# Patient Record
Sex: Male | Born: 1955
Health system: Southern US, Community
[De-identification: ages and names within clinical notes are randomized; demographics above are authoritative.]

## PROBLEM LIST (undated history)

## (undated) DIAGNOSIS — I10 Essential (primary) hypertension: Secondary | ICD-10-CM

## (undated) DIAGNOSIS — E669 Obesity, unspecified: Secondary | ICD-10-CM

## (undated) DIAGNOSIS — Z720 Tobacco use: Secondary | ICD-10-CM

## (undated) DIAGNOSIS — E785 Hyperlipidemia, unspecified: Secondary | ICD-10-CM

## (undated) DIAGNOSIS — I2510 Atherosclerotic heart disease of native coronary artery without angina pectoris: Secondary | ICD-10-CM

## (undated) HISTORY — DX: Essential (primary) hypertension: I10

## (undated) HISTORY — DX: Obesity, unspecified: E66.9

## (undated) HISTORY — DX: Atherosclerotic heart disease of native coronary artery without angina pectoris: I25.10

## (undated) HISTORY — DX: Hyperlipidemia, unspecified: E78.5

## (undated) HISTORY — DX: Tobacco use: Z72.0

---

## 2015-09-21 ENCOUNTER — Encounter: Payer: Self-pay | Admitting: Osteopathic Medicine

## 2015-09-21 ENCOUNTER — Ambulatory Visit (INDEPENDENT_AMBULATORY_CARE_PROVIDER_SITE_OTHER): Payer: 59 | Admitting: Osteopathic Medicine

## 2015-09-21 VITALS — BP 146/83 | HR 89 | Ht 66.0 in | Wt 146.0 lb

## 2015-09-21 DIAGNOSIS — Z720 Tobacco use: Secondary | ICD-10-CM

## 2015-09-21 DIAGNOSIS — J029 Acute pharyngitis, unspecified: Secondary | ICD-10-CM

## 2015-09-21 DIAGNOSIS — Z716 Tobacco abuse counseling: Secondary | ICD-10-CM

## 2015-09-21 DIAGNOSIS — Z5181 Encounter for therapeutic drug level monitoring: Secondary | ICD-10-CM

## 2015-09-21 MED ORDER — BUPROPION HCL ER (SMOKING DET) 150 MG PO TB12: ORAL_TABLET | ORAL | Status: DC

## 2015-09-21 NOTE — Progress Notes (Signed)
HPI: Carlos Valentine is a 60 y.o. male who presents to [facility] today for chief complaint of:  Chief Complaint  Patient presents with  . Establish Care    Patient wants to discuss quit smoking and has a swollen gland on left side of throat    Smoking: pt has 38 pack year smoking history and he decided he wants to quit! He is very motivated, wants to quit right away but he would like help with a prescription. He is nervous about the side effects and cost of Chantix. He has never tried to quit before.   Sore throat: feeling sick lately, has noticed mildly tender enlarged lymph node on left side of the neck, no skin changes or drainage, no difficulty swallowing.  Past medical, social and family history reviewed: History reviewed. No pertinent past medical history. History reviewed. No pertinent past surgical history. Social History  Substance Use Topics  . Smoking status: Current Every Day Smoker  . Smokeless tobacco: Not on file  . Alcohol Use: Not on file   History reviewed. No pertinent family history.  Current Outpatient Prescriptions  Medication Sig Dispense Refill  . aspirin 81 MG tablet Take 81 mg by mouth daily.     No current facility-administered medications for this visit.   No Known Allergies    Review of Systems: CONSTITUTIONAL:  No  fever, no chills, No  unintentional weight changes HEAD/EYES/EARS/NOSE/THROAT: No  headache, no vision change, no hearing change, (+) sore throat/lump in throat per HPI, No  sinus pressure CARDIAC: No  chest pain, No  pressure, No palpitations, No  orthopnea RESPIRATORY: No  cough, No  shortness of breath/wheeze GASTROINTESTINAL: No  nausea, No  vomiting, No  abdominal pain, No  blood in stool, No  diarrhea, No  constipation  MUSCULOSKELETAL: No  myalgia/arthralgia GENITOURINARY: No  incontinence, No  abnormal genital bleeding/discharge SKIN: No  rash/wounds/concerning lesions HEM/ONC: No  easy  bruising/bleeding, No  abnormal lymph node ENDOCRINE: No polyuria/polydipsia/polyphagia, No  heat/cold intolerance  NEUROLOGIC: No  weakness, No  dizziness, No  slurred speech PSYCHIATRIC: No  concerns with depression, No  concerns with anxiety, (+) sleep problems  Exam:  BP 146/83 mmHg  Pulse 89  Ht  (1.676 m)  Wt 146 lb (66.225 kg)  BMI 23.58 kg/m2 Constitutional: VS see above. General Appearance: alert, well-developed, well-nourished, NAD Eyes: Normal lids and conjunctive, non-icteric sclera, PERRLA Ears, Nose, Mouth, Throat: MMM, Normal external inspection ears/nares/mouth/lips/gums, TM normal bilaterally. Pharynx no erythema, no exudate.  Neck: No masses, trachea midline. No thyroid enlargement/tenderness/mass appreciated. Mild tender to palpation anterior cervical lymph node on left, about same size as on right, nondraining, normal pharynx, no other enlarged last palpable lymph nodes on cervical posterior chain or supraclavicular Respiratory: Normal respiratory effort. no wheeze, no rhonchi, no rales3 Cardiovascular: S1/S2 normal, no murmur, no rub/gallop auscultated. RRR.  Gastrointestinal: Nontender, no masses. No hepatomegaly, no splenomegaly. No hernia appreciated. Bowel sounds normal. Rectal exam deferred.  Musculoskeletal: Gait normal. No clubbing/cyanosis of digits.  Neurological: No cranial nerve deficit on limited exam. Motor and sensation intact and symmetric Skin: warm, dry, intact. No rash/ulcer. No concerning nevi or subq nodules on limited exam.   Psychiatric: Normal judgment/insight. Normal mood and affect. Oriented x3.    ASSESSMENT/PLAN:  Encounter for tobacco use cessation counseling  - Rx sent to [facility] per patient request, also printed Rx and attached coupon for [facility] in case can get cheaper there. - Plan: buPROPion (ZYBAN)  150 MG 12 hr tablet. Patient has been educated on significant possible side effects of medication and is instructed to contact me or  other medical professional with any concerns about side effects.   Tobacco abuse disorder - 38 years at 1 ppd. Patient has been educated >23mins on importance of tobacco cessation to decrease risks of cardiovascular and pulmonary disease and to improve overall health & wellbeing.   Med monitoring - CBC and CMP for medication safety  Sore throat - mild enlarged LN bilateral ant cervical - no post cervical or supraclavicular LN, pt advised keep an eye on this and if persists more than 1 week will consider ultrasound or other workup. CBC as above   Return at your convenience for [facility] PHYSICAL EXAM. and recheck blood pressure

## 2015-09-21 NOTE — Patient Instructions (Signed)
Smoking Cessation, Tips for Success If you are ready to quit smoking, congratulations! You have chosen to help yourself be healthier. Cigarettes bring nicotine, tar, carbon monoxide, and other irritants into your body. Your lungs, heart, and blood vessels will be able to work better without these poisons. There are many different ways to quit smoking. Nicotine gum, nicotine patches, a nicotine inhaler, or nicotine nasal spray can help with physical craving. Hypnosis, support groups, and medicines help break the habit of smoking. WHAT THINGS CAN I DO TO MAKE QUITTING EASIER?  Here are some tips to help you quit for good:  Pick a date when you will quit smoking completely. Tell all of your friends and family about your plan to quit on that date.  Do not try to slowly cut down on the number of cigarettes you are smoking. Pick a quit date and quit smoking completely starting on that day.  Throw away all cigarettes.   Clean and remove all ashtrays from your home, work, and car.  On a card, write down your reasons for quitting. Carry the card with you and read it when you get the urge to smoke.  Cleanse your body of nicotine. Drink enough water and fluids to keep your urine clear or pale yellow. Do this after quitting to flush the nicotine from your body.  Learn to predict your moods. Do not let a bad situation be your excuse to have a cigarette. Some situations in your life might tempt you into wanting a cigarette.  Never have "just one" cigarette. It leads to wanting another and another. Remind yourself of your decision to quit.  Change habits associated with smoking. If you smoked while driving or when feeling stressed, try other activities to replace smoking. Stand up when drinking your coffee. Brush your teeth after eating. Sit in a different chair when you read the paper. Avoid alcohol while trying to quit, and try to drink fewer caffeinated beverages. Alcohol and caffeine may urge you to  smoke.  Avoid foods and drinks that can trigger a desire to smoke, such as sugary or spicy foods and alcohol.  Ask people who smoke not to smoke around you.  Have something planned to do right after eating or having a cup of coffee. For example, plan to take a walk or exercise.  Try a relaxation exercise to calm you down and decrease your stress. Remember, you may be tense and nervous for the first 2 weeks after you quit, but this will pass.  Find new activities to keep your hands busy. Play with a pen, coin, or rubber band. Doodle or draw things on paper.  Brush your teeth right after eating. This will help cut down on the craving for the taste of tobacco after meals. You can also try mouthwash.   Use oral substitutes in place of cigarettes. Try using lemon drops, carrots, cinnamon sticks, or chewing gum. Keep them handy so they are available when you have the urge to smoke.  When you have the urge to smoke, try deep breathing.  Designate your home as a nonsmoking area.  If you are a heavy smoker, ask your health care provider about a prescription for nicotine chewing gum. It can ease your withdrawal from nicotine.  Reward yourself. Set aside the cigarette money you save and buy yourself something nice.  Look for support from others. Join a support group or smoking cessation program. Ask someone at home or at work to help you with your plan   to quit smoking.  Always ask yourself, "Do I need this cigarette or is this just a reflex?" Tell yourself, "Today, I choose not to smoke," or "I do not want to smoke." You are reminding yourself of your decision to quit.  Do not replace cigarette smoking with electronic cigarettes (commonly called e-cigarettes). The safety of e-cigarettes is unknown, and some may contain harmful chemicals.  If you relapse, do not give up! Plan ahead and think about what you will do the next time you get the urge to smoke. HOW WILL I FEEL WHEN I QUIT SMOKING? You  may have symptoms of withdrawal because your body is used to nicotine (the addictive substance in cigarettes). You may crave cigarettes, be irritable, feel very hungry, cough often, get headaches, or have difficulty concentrating. The withdrawal symptoms are only temporary. They are strongest when you first quit but will go away within 10-14 days. When withdrawal symptoms occur, stay in control. Think about your reasons for quitting. Remind yourself that these are signs that your body is healing and getting used to being without cigarettes. Remember that withdrawal symptoms are easier to treat than the major diseases that smoking can cause.  Even after the withdrawal is over, expect periodic urges to smoke. However, these cravings are generally short lived and will go away whether you smoke or not. Do not smoke! WHAT RESOURCES ARE AVAILABLE TO HELP ME QUIT SMOKING? Your health care provider can direct you to community resources or hospitals for support, which may include:  Group support.  Education.  Hypnosis.  Therapy.   This information is not intended to replace advice given to you by your health care provider. Make sure you discuss any questions you have with your health care provider.   Document Released: 05/19/2004 Document Revised: 09/11/2014 Document Reviewed: 02/06/2013 Elsevier Interactive Patient Education 2016 [employer].    Bupropion sustained-release tablets (smoking cessation) What is this medicine? BUPROPION (byoo PROE pee on) is used to help people quit smoking. This medicine may be used for other purposes; ask your health care provider or pharmacist if you have questions.  How should I use this medicine? Take this medicine by mouth with a glass of water. Follow the directions on the prescription label. You can take it with or without food. If it upsets your stomach, take it with food. Do not cut, crush or chew this medicine. Take your medicine at regular intervals. If  you take this medicine more than once a day, take your second dose at least 8 hours after you take your first dose. To limit difficulty in sleeping, avoid taking this medicine at bedtime. Do not take your medicine more often than directed. Do not stop taking this medicine suddenly except upon the advice of your doctor. Stopping this medicine too quickly may cause serious side effects. A special MedGuide will be given to you by the pharmacist with each prescription and refill. Be sure to read this information carefully each time. Talk to your pediatrician regarding the use of this medicine in children. Special care may be needed. Overdosage: If you think you have taken too much of this medicine contact a poison control center or emergency room at once. NOTE: This medicine is only for you. Do not share this medicine with others.  What if I miss a dose? If you miss a dose, skip the missed dose and take your next tablet at the regular time. There should be at least 8 hours between doses. Do not take  double or extra doses.  What should I watch for while using this medicine? Visit your doctor or health care professional for regular checks on your progress. This medicine should be used together with a patient support program. It is important to participate in a behavioral program, counseling, or other support program that is recommended by your health care professional. Patients and their families should watch out for new or worsening thoughts of suicide or depression. Also watch out for sudden changes in feelings such as feeling anxious, agitated, panicky, irritable, hostile, aggressive, impulsive, severely restless, overly excited and hyperactive, or not being able to sleep. If this happens, especially at the beginning of treatment or after a change in dose, call your health care professional. Avoid alcoholic drinks while taking this medicine. Drinking excessive alcoholic beverages, using sleeping or anxiety  medicines, or quickly stopping the use of these agents while taking this medicine may increase your risk for a seizure. Do not drive or use heavy machinery until you know how this medicine affects you. This medicine can impair your ability to perform these tasks. Do not take this medicine close to bedtime. It may prevent you from sleeping. Your mouth may get dry. Chewing sugarless gum or sucking hard candy, and drinking plenty of water may help. Contact your doctor if the problem does not go away or is severe. Do not use nicotine patches or chewing gum without the advice of your doctor or health care professional while taking this medicine. You may need to have your blood pressure taken regularly if your doctor recommends that you use both nicotine and this medicine together.  What side effects may I notice from receiving this medicine? Side effects that you should report to your doctor or health care professional as soon as possible: -allergic reactions like skin rash, itching or hives, swelling of the face, lips, or tongue -breathing problems -changes in vision -confusion -fast or irregular heartbeat -hallucinations -increased blood pressure -redness, blistering, peeling or loosening of the skin, including inside the mouth -seizures -suicidal thoughts or other mood changes -unusually weak or tired -vomiting  Side effects that usually do not require medical attention (report to your doctor or health care professional if they continue or are bothersome): -change in sex drive or performance -constipation -headache -loss of appetite -nausea -tremors -weight loss This list may not describe all possible side effects. Call your doctor for medical advice about side effects. You may report side effects to FDA at 1-800-FDA-1088.  Where should I keep my medicine? Keep out of the reach of children. Store at room temperature between 20 and 25 degrees C (68 and 77 degrees F). Protect from light.  Keep container tightly closed. Throw away any unused medicine after the expiration date. NOTE: This sheet is a summary. It may not cover all possible information. If you have questions about this medicine, talk to your doctor, pharmacist, or health care provider.    2016, Elsevier/Gold Standard. (2013-04-18 10:55:10)

## 2015-09-22 ENCOUNTER — Telehealth: Payer: Self-pay | Admitting: Osteopathic Medicine

## 2015-09-22 DIAGNOSIS — F17200 Nicotine dependence, unspecified, uncomplicated: Secondary | ICD-10-CM | POA: Insufficient documentation

## 2015-09-22 DIAGNOSIS — J029 Acute pharyngitis, unspecified: Secondary | ICD-10-CM | POA: Insufficient documentation

## 2015-09-22 DIAGNOSIS — Z716 Tobacco abuse counseling: Secondary | ICD-10-CM | POA: Insufficient documentation

## 2015-09-22 DIAGNOSIS — Z720 Tobacco use: Secondary | ICD-10-CM | POA: Insufficient documentation

## 2015-09-22 MED ORDER — BUPROPION HCL ER (SMOKING DET) 150 MG PO TB12: ORAL_TABLET | ORAL | Status: DC

## 2015-09-22 NOTE — Telephone Encounter (Signed)
Pt returned your call.  

## 2015-09-22 NOTE — Telephone Encounter (Signed)
I left a message on patient vm advising him to go to lab and get lab test done. Rhonda Cunningham,CMA

## 2015-09-23 ENCOUNTER — Telehealth: Payer: Self-pay | Admitting: Osteopathic Medicine

## 2015-09-23 NOTE — Telephone Encounter (Signed)
Received fax for prior authorization on Bupropion HCL ER sent through cover my meds waiting on authorization. - CF

## 2015-09-28 ENCOUNTER — Telehealth: Payer: Self-pay | Admitting: Osteopathic Medicine

## 2015-09-28 NOTE — Telephone Encounter (Signed)
Patient started his medication 09/27/15 and 09/28/15 and just wanted to let you know and will take up until 10/11/15. Thanks

## 2015-09-28 NOTE — Telephone Encounter (Signed)
OKBjorn Loser Aaralynn Shepheard,CMA

## 2015-10-13 NOTE — Telephone Encounter (Signed)
This medication is on members list of covered drugs and does not require a prior authorization. - CF

## 2016-11-13 ENCOUNTER — Ambulatory Visit: Payer: 59 | Admitting: Osteopathic Medicine

## 2016-11-16 ENCOUNTER — Encounter: Payer: Self-pay | Admitting: Osteopathic Medicine

## 2016-11-16 ENCOUNTER — Ambulatory Visit (INDEPENDENT_AMBULATORY_CARE_PROVIDER_SITE_OTHER): Payer: 59 | Admitting: Osteopathic Medicine

## 2016-11-16 VITALS — BP 131/74 | HR 80 | Ht 65.0 in | Wt 177.0 lb

## 2016-11-16 DIAGNOSIS — Z122 Encounter for screening for malignant neoplasm of respiratory organs: Secondary | ICD-10-CM | POA: Diagnosis not present

## 2016-11-16 DIAGNOSIS — F17200 Nicotine dependence, unspecified, uncomplicated: Secondary | ICD-10-CM

## 2016-11-16 DIAGNOSIS — Z0000 Encounter for general adult medical examination without abnormal findings: Secondary | ICD-10-CM | POA: Diagnosis not present

## 2016-11-16 DIAGNOSIS — N529 Male erectile dysfunction, unspecified: Secondary | ICD-10-CM

## 2016-11-16 DIAGNOSIS — Z1211 Encounter for screening for malignant neoplasm of colon: Secondary | ICD-10-CM

## 2016-11-16 MED ORDER — VARENICLINE TARTRATE 1 MG PO TABS: 1.0000 mg | ORAL_TABLET | Freq: Two times a day (BID) | ORAL | 2 refills | Status: DC

## 2016-11-16 MED ORDER — VARENICLINE TARTRATE 0.5 MG X 11 & 1 MG X 42 PO MISC: ORAL | 0 refills | Status: DC

## 2016-11-16 MED ORDER — SILDENAFIL CITRATE 20 MG PO TABS: 20.0000 mg | ORAL_TABLET | ORAL | 3 refills | Status: DC | PRN

## 2016-11-16 NOTE — Progress Notes (Signed)
HPI: Carlos Valentine is a 61 y.o. male  who presents to [facility] today, 11/16/16,  for chief complaint of:  Chief Complaint  Patient presents with  . Annual Exam    Annual physical exam.  Few minor complaints at this time.   Still smoking - quit for awhile but started back again. Would like help quitting. Open to trying Chantix but a bit nervous about side effects.   ED - able to get an erection but difficulty maintaining one. No depression/weight gain/fatigue.    Past medical, surgical, social and family history reviewed: Patient Active Problem List   Diagnosis Date Noted  . Sore throat 09/22/2015  . Encounter for tobacco use cessation counseling 09/22/2015  . Tobacco abuse disorder 09/22/2015   No past surgical history on file. Social History  Substance Use Topics  . Smoking status: Current Every Day Smoker  . Smokeless tobacco: Never Used  . Alcohol use Not on file   No family history on file.   Current medication list and allergy/intolerance information reviewed   No Known Allergies    Review of Systems:  Constitutional:  No  fever, no chills, No recent illness, No unintentional weight changes. No significant fatigue.    HEENT: No  headache, no vision change, no hearing change, No sore throat, No  sinus pressure  Cardiac: No  chest pain, No  pressure, No palpitations,  Respiratory:  No  shortness of breath. No  Cough  Gastrointestinal: No  abdominal pain, No  nausea, No  vomiting,  No  blood in stool, No  diarrhea, No  constipation   Musculoskeletal: No new myalgia/arthralgia  Genitourinary:No  abnormal genital bleeding, No abnormal genital discharge  Skin: No  Rash,   Neurologic: No  weakness, No  dizziness  Psychiatric: No  concerns with depression, No  concerns with anxiety, No sleep problems, No mood problems  Exam:  BP 120/83   Pulse 68   Ht 5\' 5"  (1.651 m)   Wt 177 lb (80.3 kg)   BMI 29.45 kg/m    Constitutional: VS see above. General Appearance: alert, well-developed, well-nourished, NAD  Eyes: Normal lids and conjunctive, non-icteric sclera  Ears, Nose, Mouth, Throat: MMM, Normal external inspection ears/nares/mouth/lips/gums. TM normal bilaterally. Pharynx/tonsils no erythema, no exudate. Nasal mucosa normal.   Neck: No masses, trachea midline. No thyroid enlargement. No tenderness/mass appreciated. No lymphadenopathy  Respiratory: Normal respiratory effort. no wheeze, no rhonchi, no rales  Cardiovascular: S1/S2 normal, no murmur, no rub/gallop auscultated. RRR. No lower extremity edema. Pedal pulse II/IV bilaterally DP and PT. No carotid bruit or JVD. No abdominal aortic bruit.  Gastrointestinal: Nontender, no masses. No hepatomegaly, no splenomegaly. No hernia appreciated. Bowel sounds normal. Rectal exam deferred.   Musculoskeletal: Gait normal. No clubbing/cyanosis of digits.   Neurological: Normal balance/coordination. No tremor. No cranial nerve deficit on limited exam. Motor and sensation intact and symmetric. Cerebellar reflexes intact.   Skin: warm, dry, intact. No rash/ulcer. No concerning nevi or subq nodules on limited exam.    Psychiatric: Normal judgment/insight. Normal mood and affect. Oriented x3.      ASSESSMENT/PLAN:   Annual physical exam - Plan: CBC with Differential/Platelet, COMPLETE METABOLIC PANEL WITH GFR, Lipid panel, TSH, HIV antibody, Hepatitis C antibody, PSA, Total with Reflex to PSA, Free  Colon cancer screening - Plan: Ambulatory referral to Gastroenterology  Encounter for screening for lung cancer - Plan: Ambulatory Referral for Lung Cancer Scre  Erectile dysfunction, unspecified erectile dysfunction type -  Plan: sildenafil (REVATIO) 20 MG tablet  Tobacco dependence - Quit date advised, Chantix Rx and opsisble options for starting/taking this medicine reviewed, stop Rx and alert me/to ER if any concerning side effects  - Plan:  Ambulatory Referral for Lung Cancer Scre, varenicline (CHANTIX CONTINUING MONTH PAK) 1 MG tablet, varenicline (CHANTIX STARTING MONTH PAK) 0.5 MG X 11 & 1 MG X 42 tablet    MALE PREVENTIVE CARE  updated 11/16/16  ANNUAL SCREENING/COUNSELING  Any changes to health in the past year? no  Diet/Exercise - HEALTHY HABITS DISCUSSED TO DECREASE CV RISK History  Smoking Status  . Current Every Day Smoker  Smokeless Tobacco  . Never Used   History  Alcohol use Not on file   [facility]  Decreased Interest 0  Down, Depressed, Hopeless 0  PHQ - 2 Score 0  Altered sleeping 0  Tired, decreased energy 0  Change in appetite 0  Feeling bad or failure about yourself  0  Trouble concentrating 0  Moving slowly or fidgety/restless 0  Suicidal thoughts 0  PHQ-9 Score 0    SEXUAL/REPRODUCTIVE HEALTH  Sexually active in the past year? - Yes with male.  STI testing needed/desired today? - no  Any concerns with testosterone/libido? - no  INFECTIOUS DISEASE SCREENING  HIV - needs  GC/CT - does not need  HepC - needs  TB - does not need  CANCER SCREENING  Lung - needs  Colon - needs  Prostate - does not need  OTHER DISEASE SCREENING  Lipid - needs  DM2 - needs  AAA - will need US at 65  ADULT VACCINATION  Influenza - annual vaccine recommended  Td - booster every 10 years   Zoster - option at 3650, yes at 60+   PCV13 - was not indicated  PPSV23 - was not indicated Immunization History  Administered Date(s) Administered  . Tetanus 01/22/2015    OTHER  Fall - exercise and Vit D age 67+ - does not need  Consider ASA - age 60-59 - needs    Visit summary with medication list and pertinent instructions was printed for patient to review. All questions at time of visit were answered - patient instructed to contact office with any additional concerns. ER/RTC precautions were reviewed with the patient. Follow-up plan: Return for annual  physical when due, sooner i fneeded and depending on labs .

## 2016-11-22 ENCOUNTER — Telehealth: Payer: Self-pay | Admitting: *Deleted

## 2016-11-22 NOTE — Telephone Encounter (Signed)
Pre Authorization sent to cover my meds. [id number]

## 2016-11-23 NOTE — Telephone Encounter (Signed)
chantix denied by insurance. They want him to have tried and failed Nicoderm OTC, Nicorette gum OTC, Nicorette lozenge OTC,Thrime gum OTC, Thrive lozenge otc, Nicorette mini-lozenge otc

## 2016-11-24 NOTE — Telephone Encounter (Signed)
Left detailed message on patient vm with information as noted below. Advised patient to call us back with that information. Millie Shorb,CMA

## 2016-11-24 NOTE — Telephone Encounter (Signed)
Can we call patient and confirm whether he has tried any of the other over-the-counter medications to help quit smoking? Let him know we need to know that he has tried these and document that in his chart before the Chantix will be approved.

## 2016-11-29 NOTE — Telephone Encounter (Signed)
Patient came into the office and states he has tried otc nicorette  . An appeal will be sent

## 2016-11-30 NOTE — Telephone Encounter (Signed)
Patient states he has tried nicorette patch otc. I will submit an appeal.

## 2016-12-20 ENCOUNTER — Encounter: Payer: Self-pay | Admitting: Osteopathic Medicine

## 2018-03-13 ENCOUNTER — Encounter: Payer: Self-pay | Admitting: Family Medicine

## 2018-03-13 ENCOUNTER — Ambulatory Visit (INDEPENDENT_AMBULATORY_CARE_PROVIDER_SITE_OTHER): Payer: 59 | Admitting: Family Medicine

## 2018-03-13 VITALS — BP 160/88 | HR 94 | Wt 192.0 lb

## 2018-03-13 DIAGNOSIS — Z716 Tobacco abuse counseling: Secondary | ICD-10-CM

## 2018-03-13 DIAGNOSIS — F17200 Nicotine dependence, unspecified, uncomplicated: Secondary | ICD-10-CM | POA: Diagnosis not present

## 2018-03-13 DIAGNOSIS — Z720 Tobacco use: Secondary | ICD-10-CM

## 2018-03-13 DIAGNOSIS — M25511 Pain in right shoulder: Secondary | ICD-10-CM | POA: Diagnosis not present

## 2018-03-13 MED ORDER — VARENICLINE TARTRATE 1 MG PO TABS: 1.0000 mg | ORAL_TABLET | Freq: Two times a day (BID) | ORAL | 2 refills | Status: DC

## 2018-03-13 MED ORDER — VARENICLINE TARTRATE 0.5 MG X 11 & 1 MG X 42 PO MISC: ORAL | 0 refills | Status: DC

## 2018-03-13 NOTE — Progress Notes (Signed)
Carlos Valentine is a 62 y.o. male who [facility] today for right shoulder pain.  Carlos Valentine as had pain in his right shoulder now for the last 2 to 3 weeks.  He cannot recall any specific injury.  He notes the pain occurs in his right lateral upper arm and into the right trapezius and rhomboid.  He notes pain is worse with overhead motion reaching back and at bedtime.  He denies any pain radiating below the level of his elbow.  He denies any weakness or numbness below the level of elbow.  He is tried some over-the-counter ibuprofen type medications which have helped a bit.  He denies any fevers or chills nausea vomiting or diarrhea.  Additionally Carlos Valentine is trying to quit smoking.  He is tried bupropion which is not been very effective.  Additionally he is tried other tobacco cessation methods including patches gums or lozenges.  These have not been very effective and is willing to try Chantix.     ROS:  As above  Exam:  BP (!) 160/88   Pulse 94   Wt 192 lb (87.1 kg)   BMI 31.95 kg/m  General: Well Developed, well nourished, and in no acute distress.  Neuro/Psych: Alert and oriented x3, extra-ocular muscles intact, able to move all 4 extremities, sensation grossly intact. Skin: Warm and dry, no rashes noted.  Respiratory: Not using accessory muscles, speaking in full sentences, trachea midline.  Cardiovascular: Pulses palpable, no extremity edema. Abdomen: Does not appear distended. MSK:  Right shoulder normal-appearing with no swelling erythema or ecchymosis. Mildly tender to palpation at [facility] joint. Range of motion full abduction but painful arc beyond 100 degrees.  Normal external rotation.  Internal rotation limited to the lumbar spine. Positive Hawkins and Neer's test. Positive empty can test. Negative Yergason's and speeds test. Strength slightly decreased 4+/5 to abduction.  Normal 5/5 to external and internal  rotation.  Contralateral left shoulder normal-appearing nontender normal motion negative impingement testing normal strength throughout.  C-spine: Nontender to spinal midline normal neck motion. Mildly tender to palpation along the right trapezius and right rhomboid area.  Pulses capillary refill sensation are intact distally.  Procedure: Real-time Ultrasound Guided Injection of right subacromial bursa Device: GE Logiq E   Images permanently stored and available for review in the ultrasound unit. Verbal informed consent obtained.  Discussed risks and benefits of procedure. Warned about infection bleeding damage to structures skin hypopigmentation and fat atrophy among others. Patient expresses understanding and agreement Time-out conducted.   Noted no overlying erythema, induration, or other signs of local infection.   Skin prepped in a sterile fashion.   Local anesthesia: Topical Ethyl chloride.   With sterile technique and under real time ultrasound guidance:  40 mg of Kenalog and 2 mL of Marcaine injected easily.   Completed without difficulty   Pain immediately resolved suggesting accurate placement of the medication.   Advised to call if fevers/chills, erythema, induration, drainage, or persistent bleeding.   Images permanently stored and available for review in the ultrasound unit.  Impression: Technically successful ultrasound guided injection.       Assessment and Plan: 62 y.o. male with  Right shoulder pain due to subacromial bursitis versus rotator cuff tendinitis.  Patient had significant symptom improvement following injection. Plan to continue over-the-counter medications as needed for pain control.  Start home exercise program working on rotator cuff strengthening.  Handout provided.  Recheck in a few weeks.  Consider  physical therapy referral as needed.  Additionally patient is interested in quitting smoking.  These had treatment failures of nicotine replacement as  well as bupropion.  Plan to prescribe Chantix and follow-up with PCP.    No orders of the defined types were placed in this encounter.  Meds ordered this encounter  Medications  . varenicline (CHANTIX STARTING MONTH PAK) 0.5 MG X 11 & 1 MG X 42 tablet    Sig: Take one 0.5 mg tablet by mouth once daily for 3 days, then increase to one 0.5 mg tablet twice daily for 4 days, then increase to one 1 mg tablet twice daily.    Dispense:  42 tablet    Refill:  0    Pt tried and failed buproprion  . varenicline (CHANTIX CONTINUING MONTH PAK) 1 MG tablet    Sig: Take 1 tablet (1 mg total) by mouth 2 (two) times daily.    Dispense:  60 tablet    Refill:  2    Pt tried and failed buproprion    Historical information moved to improve visibility of documentation.  History reviewed. No pertinent past medical history. History reviewed. No pertinent surgical history. Social History   Tobacco Use  . Smoking status: Current Every Day Smoker  . Smokeless tobacco: Never Used  Substance Use Topics  . Alcohol use: Not on file   family history is not on file.  Medications: Current Outpatient Medications  Medication Sig Dispense Refill  . aspirin 81 MG tablet Take 81 mg by mouth daily.    . sildenafil (REVATIO) 20 MG tablet Take 1-5 tablets (20-100 mg total) by mouth as needed. Take 1 hour prior to sex. Use lowest effective dose 30 tablet 3  . varenicline (CHANTIX CONTINUING MONTH PAK) 1 MG tablet Take 1 tablet (1 mg total) by mouth 2 (two) times daily. 60 tablet 2  . varenicline (CHANTIX STARTING MONTH PAK) 0.5 MG X 11 & 1 MG X 42 tablet Take one 0.5 mg tablet by mouth once daily for 3 days, then increase to one 0.5 mg tablet twice daily for 4 days, then increase to one 1 mg tablet twice daily. 42 tablet 0   No current facility-administered medications for this visit.    No Known Allergies    Discussed warning signs or symptoms. Please see discharge instructions. Patient expresses  understanding.

## 2018-03-13 NOTE — Patient Instructions (Signed)
Thank you for coming in today. Call or go to the ER if you develop a large red swollen joint with extreme pain or oozing puss.  Do  Home exercises.   Up to the front  Up to the side Rotate in and rotate out  30 reps 2x daily.   Chantix for smoking.    Recheck with me in 4 weeks.   Return sooner if needed.

## 2018-03-14 ENCOUNTER — Encounter: Payer: Self-pay | Admitting: Family Medicine

## 2018-03-18 ENCOUNTER — Telehealth: Payer: Self-pay | Admitting: Osteopathic Medicine

## 2018-03-18 NOTE — Telephone Encounter (Signed)
[facility] that Chantix was [facility] under patients plan. Form sent to scan.

## 2018-04-16 ENCOUNTER — Ambulatory Visit: Payer: 59 | Admitting: Family Medicine

## 2018-05-02 ENCOUNTER — Ambulatory Visit (INDEPENDENT_AMBULATORY_CARE_PROVIDER_SITE_OTHER): Payer: 59 | Admitting: Family Medicine

## 2018-05-02 ENCOUNTER — Encounter: Payer: Self-pay | Admitting: Family Medicine

## 2018-05-02 VITALS — BP 149/100 | HR 92 | Ht 65.0 in | Wt 194.0 lb

## 2018-05-02 DIAGNOSIS — Z720 Tobacco use: Secondary | ICD-10-CM | POA: Diagnosis not present

## 2018-05-02 DIAGNOSIS — M25511 Pain in right shoulder: Secondary | ICD-10-CM

## 2018-05-02 NOTE — Patient Instructions (Addendum)
Thank you for coming in today. Attend physical therapy.  Recheck in 6 weeks.   Return sooner if needed.   Continue home exercises.

## 2018-05-02 NOTE — Progress Notes (Signed)
Carlos Valentine is a 62 y.o. male who presents to [facility] today for right shoulder pain.  Carlos Valentine was seen about 6 weeks ago for right shoulder pain.  He had a trial of home exercise program at subacromial bursa injection.  He had considerable improvement in symptoms.  He notes he still has pain in his shoulder but is a lot improved.  Continues to have pain with overhead motion and at night.  The pain does continue to interfere with his activities at home and quality of life.  He has some pain at work as well.  He is happy with how things are going but would like it to be a bit more improved.  The last visit he was prescribed Chantix for smoking cessation.  He is started Chantix and is tolerating it well and reduced his smoking from about a pack and a half of day down to less than a half a pack a day.    ROS:  As above  Exam:  BP (!) 149/100   Pulse 92   Ht 5\' 5"  (1.651 m)   Wt 194 lb (88 kg)   BMI 32.28 kg/m  General: Well Developed, well nourished, and in no acute distress.  Neuro/Psych: Alert and oriented x3, extra-ocular muscles intact, able to move all 4 extremities, sensation grossly intact. Skin: Warm and dry, no rashes noted.  Respiratory: Not using accessory muscles, speaking in full sentences, trachea midline.  Cardiovascular: Pulses palpable, no extremity edema. Abdomen: Does not appear distended. MSK:  Right shoulder: Normal-appearing nontender. Full abduction range of motion but pain beyond 120 degrees. External rotation normal Internal rotation limited to lumbar spine. Positive Hawkins and Neer's test. Positive empty can test. Normal strength.  Shoulder: Normal under. Normal motion. Negative testing. Normal strength.  Pulses capillary fill and sensation are intact bilateral upper extremities.     Assessment and Plan: 62 y.o. male with  Right shoulder pain: Improved but not resolved with subacromial  injection.  Plan to refer to physical therapy for trial physical therapy.  Recheck in about 6 weeks.  If not improving next step would be MRI for further injection or surgical planning.  Smoking: Improved with Chantix.  Continue to wean.    Orders Placed This Encounter  Procedures  . Ambulatory referral to Physical Therapy    Referral Priority:   Routine    Referral Type:   Physical Medicine    Referral Reason:   Specialty Services Required    Requested Specialty:   Physical Therapy   No orders of the defined types were placed in this encounter.   Historical information moved to improve visibility of documentation.  No past medical history on file. No past surgical history on file. Social History   Tobacco Use  . Smoking status: Current Every Day Smoker  . Smokeless tobacco: Never Used  Substance Use Topics  . Alcohol use: Not on file   family history is not on file.  Medications: Current Outpatient Medications  Medication Sig Dispense Refill  . aspirin 81 MG tablet Take 81 mg by mouth daily.    . sildenafil (REVATIO) 20 MG tablet Take 1-5 tablets (20-100 mg total) by mouth as needed. Take 1 hour prior to sex. Use lowest effective dose 30 tablet 3  . varenicline (CHANTIX CONTINUING MONTH PAK) 1 MG tablet Take 1 tablet (1 mg total) by mouth 2 (two) times daily. 60 tablet 2   No current facility-administered medications for this visit.  No Known Allergies    Discussed warning signs or symptoms. Please see discharge instructions. Patient expresses understanding.

## 2018-06-11 ENCOUNTER — Ambulatory Visit: Payer: 59 | Admitting: Family Medicine

## 2018-07-01 ENCOUNTER — Emergency Department
Admission: EM | Admit: 2018-07-01 | Discharge: 2018-07-01 | Disposition: A | Discharge status: Home / Self Care | Payer: 59 | Source: Home / Self Care | Attending: Family Medicine | Admitting: Family Medicine

## 2018-07-01 ENCOUNTER — Emergency Department (INDEPENDENT_AMBULATORY_CARE_PROVIDER_SITE_OTHER): Payer: 59

## 2018-07-01 ENCOUNTER — Other Ambulatory Visit: Payer: Self-pay

## 2018-07-01 DIAGNOSIS — W268XXA Contact with other sharp object(s), not elsewhere classified, initial encounter: Secondary | ICD-10-CM | POA: Diagnosis not present

## 2018-07-01 DIAGNOSIS — S61042A Puncture wound with foreign body of left thumb without damage to nail, initial encounter: Secondary | ICD-10-CM

## 2018-07-01 DIAGNOSIS — S60352A Superficial foreign body of left thumb, initial encounter: Secondary | ICD-10-CM

## 2018-07-01 DIAGNOSIS — S6992XA Unspecified injury of left wrist, hand and finger(s), initial encounter: Secondary | ICD-10-CM

## 2018-07-01 MED ORDER — CEPHALEXIN 500 MG PO CAPS: 500.0000 mg | ORAL_CAPSULE | Freq: Three times a day (TID) | ORAL | 0 refills | Status: DC

## 2018-07-01 MED ORDER — TRAMADOL HCL 50 MG PO TABS: 50.0000 mg | ORAL_TABLET | Freq: Four times a day (QID) | ORAL | 0 refills | Status: DC | PRN

## 2018-07-01 NOTE — Discharge Instructions (Addendum)
°  Keep wound clean and covered, replacing the bandage 2-3 times daily until it has healed. Please follow up if you are concerned for infection or poor wound healing.   Please take antibiotics as prescribed and be sure to complete entire course even if you start to feel better to ensure infection does not come back.  Tramadol is strong pain medication. While taking, do not drink alcohol, drive, or perform any other activities that requires focus while taking these medications.

## 2018-07-01 NOTE — ED Provider Notes (Signed)
[facility]    CSN: [id number] Arrival date & time: 07/01/18  1517     History   Chief Complaint Chief Complaint  Patient presents with  . fish hook in thumb    HPI Carlos Valentine is a 62 y.o. male.   HPI  Carlos Valentine is a 62 y.o. male presenting to UC with c/o fishhook in his Left thumb for about 1 week. He attempted to remove at home w/o success.  Mild pain, swelling and redness around entry point of fish hook. Pain is minimally sore. No fever. Last tetanus: 2016.    History reviewed. No pertinent past medical history.  Patient Active Problem List   Diagnosis Date Noted  . Fishhook injury to finger, left, initial encounter 07/01/2018  . Sore throat 09/22/2015  . Encounter for tobacco use cessation counseling 09/22/2015  . Tobacco abuse disorder 09/22/2015    History reviewed. No pertinent surgical history.     Home Medications    Prior to Admission medications   Medication Sig Start Date End Date Taking? Authorizing Provider  aspirin 81 MG tablet Take 81 mg by mouth daily.    [provider]  cephALEXin (KEFLEX) 500 MG capsule Take 1 capsule (500 mg total) by mouth 3 (three) times daily. 07/01/18   Lurene Shadow, PA-C  sildenafil (REVATIO) 20 MG tablet Take 1-5 tablets (20-100 mg total) by mouth as needed. Take 1 hour prior to sex. Use lowest effective dose 11/16/16   Sunnie Nielsen, DO  traMADol (ULTRAM) 50 MG tablet Take 1 tablet (50 mg total) by mouth every 6 (six) hours as needed. 07/01/18   Lurene Shadow, PA-C  varenicline (CHANTIX CONTINUING MONTH PAK) 1 MG tablet Take 1 tablet (1 mg total) by mouth 2 (two) times daily. 03/13/18   Rodolph Bong, MD    Family History History reviewed. No pertinent family history.  Social History Social History   Tobacco Use  . Smoking status: Current Every Day Smoker  . Smokeless tobacco: Never Used  Substance Use Topics  . Alcohol use: Never    Alcohol/week: 0.0 standard drinks   Frequency: Never  . Drug use: Not Currently     Allergies   Patient has no known allergies.   Review of Systems Review of Systems  Musculoskeletal: Negative for arthralgias and joint swelling.  Skin: Positive for color change and wound.  Neurological: Negative for weakness and numbness.     Physical Exam Triage Vital Signs ED Triage Vitals  Enc Vitals Group     BP 07/01/18 1547 (!) 154/104     Pulse Rate 07/01/18 1547 88     Resp --      Temp 07/01/18 1547 97.9 F (36.6 C)     Temp Source 07/01/18 1547 Oral     SpO2 07/01/18 1547 97 %     Weight 07/01/18 1549 187 lb (84.8 kg)     Height 07/01/18 1549 5\' 6"  (1.676 m)     Head Circumference --      Peak Flow --      Pain Score 07/01/18 1548 0     Pain Loc --      Pain Edu? --      Excl. in GC? --    No data found.  Updated Vital Signs BP (!) 154/104 (BP Location: Right Arm)   Pulse 88   Temp 97.9 F (36.6 C) (Oral)   Ht 5\' 6"  (1.676 m)   Wt 187 lb (84.8 kg)  SpO2 97%   BMI 30.18 kg/m   Visual Acuity Right Eye Distance:   Left Eye Distance:   Bilateral Distance:    Right Eye Near:   Left Eye Near:    Bilateral Near:     Physical Exam  Constitutional: He is oriented to person, place, and time. He appears well-developed and well-nourished.  HENT:  Head: Normocephalic and atraumatic.  Eyes: EOM are normal.  Neck: Normal range of motion.  Cardiovascular: Normal rate.  Pulmonary/Chest: Effort normal.  Musculoskeletal: Normal range of motion. He exhibits tenderness. He exhibits no edema.  Left thumb: full ROM. Mild tenderness around fishhook (see skin exam)  Neurological: He is alert and oriented to person, place, and time.  Skin: Skin is warm and dry. Capillary refill takes less than 2 seconds. There is erythema.  Left thumb, volar aspect: puncture wound to proximal aspect. Surrounding erythema. Tip of hook visible, barbed end under the skin, not visible.   Psychiatric: He has a normal mood and affect.  His behavior is normal.  Nursing note and vitals reviewed.    UC Treatments / Results  Labs (all labs ordered are listed, but only abnormal results are displayed) Labs Reviewed - No data to display  EKG None  Radiology Dg Hand Complete Left  Result Date: 07/01/2018 CLINICAL DATA:  Fish hook stuck at base of LEFT thumb since last Friday EXAM: LEFT HAND - COMPLETE 3+ VIEW COMPARISON:  None FINDINGS: Borderline decreased osseous mineralization. Jewelry artifact at proximal phalanx ring finger. Joint spaces preserved. No acute fracture, dislocation, or bone destruction. Curvilinear metallic radiopacity with distal barb identified at the ulnar soft tissues at the base of the proximal phalanx of the LEFT thumb overall 7 mm length consistent with a retained distal fishhook fragment. IMPRESSION: Retained distal fishhook fragment with barb at proximal phalanx LEFT thumb. No acute osseous abnormalities. Electronically Signed   By: Ulyses Southward M.D.   On: 07/01/2018 16:15    Procedures Foreign Body Removal Date/Time: 07/02/2018 10:56 AM Performed by: Lurene Shadow, PA-C Authorized by: Lattie Haw, MD   Consent:    Consent obtained:  Verbal   Consent given by:  Patient   Risks discussed:  Bleeding, infection, nerve damage, pain, worsening of condition and incomplete removal   Alternatives discussed:  No treatment and alternative treatment Location:    Location:  Finger   Finger location:  L thumb   Depth:  Intradermal   Tendon involvement:  None Pre-procedure details:    Imaging:  X-ray   Neurovascular status: intact     Preparation: Patient was prepped and draped in usual sterile fashion   Anesthesia (see MAR for exact dosages):    Anesthesia method:  Local infiltration   Local anesthetic:  Lidocaine 1% w/o epi Procedure type:    Procedure complexity:  Simple Procedure details:    Scalpel size:  11   Incision length:  2mm   Localization method:  Visualized and probed    Dissection of underlying tissues: no     Bloodless field: no     Removal mechanism:  Hemostat   Foreign bodies recovered:  None Post-procedure details:    Neurovascular status: intact     Patient tolerance of procedure:  Tolerated with difficulty (unable to successfully remove fishhook ) Comments:     Concern hook was caught in tendon of thumb.  Procedure stopped after several unsuccessful attempts at gently removing hook.  Consulted with Dr. Benjamin Stain who was able to successfully remove  hook. See consult note.     (including critical care time)  Medications Ordered in UC Medications - No data to display  Initial Impression / Assessment and Plan / UC Course  I have reviewed the triage vital signs and the nursing notes.  Pertinent labs & imaging results that were available during my care of the patient were reviewed by me and considered in my medical decision making (see chart for details).     Reviewed imaging prior to attempting removal of hook. Hook is flattened, unable to push barbed end through the other side of skin. See procedure note and comments above.  Final Clinical Impressions(s) / UC Diagnoses   Final diagnoses:  Foreign body of left thumb, initial encounter     Discharge Instructions      Keep wound clean and covered, replacing the bandage 2-3 times daily until it has healed. Please follow up if you are concerned for infection or poor wound healing.   Please take antibiotics as prescribed and be sure to complete entire course even if you start to feel better to ensure infection does not come back.  Tramadol is strong pain medication. While taking, do not drink alcohol, drive, or perform any other activities that requires focus while taking these medications.      ED Prescriptions    Medication Sig Dispense Auth. Provider   cephALEXin (KEFLEX) 500 MG capsule  (Status: Discontinued) Take 1 capsule (500 mg total) by mouth 3 (three) times daily. 21 capsule  Doroteo Glassman, [place] O, PA-C   traMADol (ULTRAM) 50 MG tablet Take 1 tablet (50 mg total) by mouth every 6 (six) hours as needed. 10 tablet Waylan Rocher O, PA-C   cephALEXin (KEFLEX) 500 MG capsule Take 1 capsule (500 mg total) by mouth 3 (three) times daily. 21 capsule Lurene Shadow, PA-C     Controlled Substance Prescriptions [place] Controlled Substance Registry consulted? Yes, I have consulted the [place] Controlled Substances Registry for this patient, and feel the risk/benefit ratio today is favorable for proceeding with this prescription for a controlled substance.   Lurene Shadow, PA-C 07/02/18 1059

## 2018-07-01 NOTE — Assessment & Plan Note (Signed)
Unable to continue pushing the fishhook through for removal, so I made an incision on the thumb and removed it the way it came in. Neurovascularly intact after removal. Good function to flexion at the MCP and IP joints after procedure. Irrigated, Keflex 500mg  4 times daily for 7 days. Follow-up with PCP in 1 to 2 weeks.

## 2018-07-01 NOTE — Consult Note (Signed)
Subjective:    CC: Foreign body in left hand  HPI:  Earlier today this pleasant 62 year old male was fishing, he got a fishhook in his left thumb just proximal to the interphalangeal joint.  He tried to push it all the way through but was unable to.  He presented to urgent care for further evaluation.  At the urgent care provider attempted fishhook removal but it was unable to be dislodged and I am called for definitive treatment.  Pain is moderate, persistent, localized without radiation.  I reviewed the past medical history, family history, social history, surgical history, and allergies today and no changes were needed.  Please see the problem list section below in epic for further details.  Past Medical History: History reviewed. No pertinent past medical history. Past Surgical History: History reviewed. No pertinent surgical history. Social History: Social History   Socioeconomic History  . Marital status: Legally Separated    Spouse name: Not on file  . Number of children: Not on file  . Years of education: Not on file  . Highest education level: Not on file  Occupational History  . Not on file  Social Needs  . Financial resource strain: Not on file  . Food insecurity:    Worry: Not on file    Inability: Not on file  . Transportation needs:    Medical: Not on file    Non-medical: Not on file  Tobacco Use  . Smoking status: Current Every Day Smoker  . Smokeless tobacco: Never Used  Substance and Sexual Activity  . Alcohol use: Never    Alcohol/week: 0.0 standard drinks    Frequency: Never  . Drug use: Not Currently  . Sexual activity: Not on file  Lifestyle  . Physical activity:    Days per week: Not on file    Minutes per session: Not on file  . Stress: Not on file  Relationships  . Social connections:    Talks on phone: Not on file    Gets together: Not on file    Attends religious service: Not on file    Active member of club or organization: Not on file      Attends meetings of clubs or organizations: Not on file    Relationship status: Not on file  Other Topics Concern  . Not on file  Social History Narrative  . Not on file   Family History: History reviewed. No pertinent family history. Allergies: No Known Allergies Medications: See med rec.  Review of Systems: No headache, visual changes, nausea, vomiting, diarrhea, constipation, dizziness, abdominal pain, skin rash, fevers, chills, night sweats, swollen lymph nodes, weight loss, chest pain, body aches, joint swelling, muscle aches, shortness of breath, mood changes, visual or auditory hallucinations.  Objective:    General: Well Developed, well nourished, and in no acute distress.  Neuro: Alert and oriented x3, extra-ocular muscles intact, sensation grossly intact.  HEENT: Normocephalic, atraumatic, pupils equal round reactive to light, neck supple, no masses, no lymphadenopathy, thyroid nonpalpable.  Skin: Warm and dry, no rashes noted.  Cardiac: Regular rate and rhythm, no murmurs rubs or gallops.  Respiratory: Clear to auscultation bilaterally. Not using accessory muscles, speaking in full sentences.  Abdominal: Soft, nontender, nondistended, positive bowel sounds, no masses, no organomegaly.  Left hand: Full strength and range of motion of the thumb to flexion and extension at the interphalangeal joint and metacarpophalangeal joint, good sensation at the pad.  X-rays reviewed and show a partial fishhook in the subcutaneous  tissues of the left thumb.  Procedure:  Removal of foreign body. Risks, benefits, alternatives explained to patient. Consent obtained. Time out conducted. Noted no overlying induration or erythema at site of injection. A small amount of lidocaine infiltrated under and around the foreign body for local anesthesia. Once adequate anesthesia was ensured I made an initial attempt to pass the hook all the way through, it is too short to pass through so I made a  small incision approximately one half of a centimeter deep on the barbed side of the hook and I was able to remove it the way it came in. The wound was irrigated. Antibiotic ointment applied. Wound dressed. Advised to return if increased redness, swelling, drainage, fevers, or chills.  Impression and Recommendations:    The patient was counselled, risk factors were discussed, anticipatory guidance given.  Fishhook injury to finger, left, initial encounter Unable to continue pushing the fishhook through for removal, so I made an incision on the thumb and removed it the way it came in. Neurovascularly intact after removal. Good function to flexion at the MCP and IP joints after procedure. Irrigated, Keflex 500mg  4 times daily for 7 days. Follow-up with PCP in 1 to 2 weeks. ___________________________________________ Ihor Austin. Benjamin Stain, M.D., ABFM., CAQSM. Primary Care and Sports Medicine [facility]  Adjunct Professor of Family Medicine  [employer]

## 2018-07-01 NOTE — ED Triage Notes (Signed)
Last Friday, pt had a fish hook on his left thumb, cut off the part sticking out.  Wants it removed today.

## 2018-07-15 ENCOUNTER — Encounter: Payer: Self-pay | Admitting: Physician Assistant

## 2018-07-15 ENCOUNTER — Ambulatory Visit (INDEPENDENT_AMBULATORY_CARE_PROVIDER_SITE_OTHER): Payer: 59 | Admitting: Physician Assistant

## 2018-07-15 VITALS — BP 149/88 | HR 88 | Resp 14 | Wt 199.0 lb

## 2018-07-15 DIAGNOSIS — J410 Simple chronic bronchitis: Secondary | ICD-10-CM

## 2018-07-15 DIAGNOSIS — F17200 Nicotine dependence, unspecified, uncomplicated: Secondary | ICD-10-CM

## 2018-07-15 DIAGNOSIS — Z13220 Encounter for screening for lipoid disorders: Secondary | ICD-10-CM | POA: Diagnosis not present

## 2018-07-15 DIAGNOSIS — I10 Essential (primary) hypertension: Secondary | ICD-10-CM

## 2018-07-15 DIAGNOSIS — E782 Mixed hyperlipidemia: Secondary | ICD-10-CM

## 2018-07-15 MED ORDER — AMLODIPINE BESYLATE 10 MG PO TABS: 5.0000 mg | ORAL_TABLET | Freq: Every day | ORAL | 2 refills | Status: DC

## 2018-07-15 NOTE — Patient Instructions (Addendum)
For your blood pressure: - Goal <130/80 (Ideally 120's/70's) - take your blood pressure medication in the morning (unless instructed differently) - take baby aspirin 81 mg daily to help prevent heart attack/stroke - monitor and log blood pressures at home - check around the same time each day in a relaxed setting - Limit salt to <2500 mg/day - Follow DASH (Dietary Approach to Stopping Hypertension) eating plan - Try to get at least 150 minutes of aerobic exercise per week - Aim to go on a brisk walk 30 minutes per day at least 5 days per week. If you're not active, gradually increase how long you walk by 5 minutes each week - limit alcohol: 2 standard drinks per day for men and 1 per day for women - avoid tobacco/nicotine products. Consider smoking cessation if you smoke - weight loss: 7% of current body weight can reduce your blood pressure by 5-10 points - follow-up at least every 6 months for your blood pressure. Follow-up sooner if your BP is not controlled   DASH Eating Plan DASH stands for "Dietary Approaches to Stop Hypertension." The DASH eating plan is a healthy eating plan that has been shown to reduce high blood pressure (hypertension). It may also reduce your risk for type 2 diabetes, heart disease, and stroke. The DASH eating plan may also help with weight loss. What are tips for following this plan? General guidelines  Avoid eating more than 2,300 mg (milligrams) of salt (sodium) a day. If you have hypertension, you may need to reduce your sodium intake to 1,500 mg a day.  Limit alcohol intake to no more than 1 drink a day for nonpregnant women and 2 drinks a day for men. One drink equals 12 oz of beer, 5 oz of wine, or 1 oz of hard liquor.  Work with your health care provider to maintain a healthy body weight or to lose weight. Ask what an ideal weight is for you.  Get at least 30 minutes of exercise that causes your heart to beat faster (aerobic exercise) most days of the  week. Activities may include walking, swimming, or biking.  Work with your health care provider or diet and nutrition specialist (dietitian) to adjust your eating plan to your individual calorie needs. Reading food labels  Check food labels for the amount of sodium per serving. Choose foods with less than 5 percent of the Daily Value of sodium. Generally, foods with less than 300 mg of sodium per serving fit into this eating plan.  To find whole grains, look for the word "whole" as the first word in the ingredient list. Shopping  Buy products labeled as "low-sodium" or "no salt added."  Buy fresh foods. Avoid canned foods and premade or frozen meals. Cooking  Avoid adding salt when cooking. Use salt-free seasonings or herbs instead of table salt or sea salt. Check with your health care provider or pharmacist before using salt substitutes.  Do not fry foods. Cook foods using healthy methods such as baking, boiling, grilling, and broiling instead.  Cook with heart-healthy oils, such as olive, canola, soybean, or sunflower oil. Meal planning   Eat a balanced diet that includes: ? 5 or more servings of fruits and vegetables each day. At each meal, try to fill half of your plate with fruits and vegetables. ? Up to 6-8 servings of whole grains each day. ? Less than 6 oz of lean meat, poultry, or fish each day. A 3-oz serving of meat is about the  same size as a deck of cards. One egg equals 1 oz. ? 2 servings of low-fat dairy each day. ? A serving of nuts, seeds, or beans 5 times each week. ? Heart-healthy fats. Healthy fats called Omega-3 fatty acids are found in foods such as flaxseeds and coldwater fish, like sardines, salmon, and mackerel.  Limit how much you eat of the following: ? Canned or prepackaged foods. ? Food that is high in trans fat, such as fried foods. ? Food that is high in saturated fat, such as fatty meat. ? Sweets, desserts, sugary drinks, and other foods with added  sugar. ? Full-fat dairy products.  Do not salt foods before eating.  Try to eat at least 2 vegetarian meals each week.  Eat more home-cooked food and less restaurant, buffet, and fast food.  When eating at a restaurant, ask that your food be prepared with less salt or no salt, if possible. What foods are recommended? The items listed may not be a complete list. Talk with your dietitian about what dietary choices are best for you. Grains Whole-grain or whole-wheat bread. Whole-grain or whole-wheat pasta. Brown rice. Orpah Cobb. Bulgur. Whole-grain and low-sodium cereals. Pita bread. Low-fat, low-sodium crackers. Whole-wheat flour tortillas. Vegetables Fresh or frozen vegetables (raw, steamed, roasted, or grilled). Low-sodium or reduced-sodium tomato and vegetable juice. Low-sodium or reduced-sodium tomato sauce and tomato paste. Low-sodium or reduced-sodium canned vegetables. Fruits All fresh, dried, or frozen fruit. Canned fruit in natural juice (without added sugar). Meat and other protein foods Skinless chicken or [place]. Ground chicken or [place]. Pork with fat trimmed off. Fish and seafood. Egg whites. Dried beans, peas, or lentils. Unsalted nuts, nut butters, and seeds. Unsalted canned beans. Lean cuts of beef with fat trimmed off. Low-sodium, lean deli meat. Dairy Low-fat (1%) or fat-free (skim) milk. Fat-free, low-fat, or reduced-fat cheeses. Nonfat, low-sodium ricotta or cottage cheese. Low-fat or nonfat yogurt. Low-fat, low-sodium cheese. Fats and oils Soft margarine without trans fats. Vegetable oil. Low-fat, reduced-fat, or light mayonnaise and salad dressings (reduced-sodium). Canola, safflower, olive, soybean, and sunflower oils. Avocado. Seasoning and other foods Herbs. Spices. Seasoning mixes without salt. Unsalted popcorn and pretzels. Fat-free sweets. What foods are not recommended? The items listed may not be a complete list. Talk with your dietitian about what  dietary choices are best for you. Grains Baked goods made with fat, such as croissants, muffins, or some breads. Dry pasta or rice meal packs. Vegetables Creamed or fried vegetables. Vegetables in a cheese sauce. Regular canned vegetables (not low-sodium or reduced-sodium). Regular canned tomato sauce and paste (not low-sodium or reduced-sodium). Regular tomato and vegetable juice (not low-sodium or reduced-sodium). Rosita Fire. Olives. Fruits Canned fruit in a light or heavy syrup. Fried fruit. Fruit in cream or butter sauce. Meat and other protein foods Fatty cuts of meat. Ribs. Fried meat. Tomasa Blase. Sausage. Bologna and other processed lunch meats. Salami. Fatback. Hotdogs. Bratwurst. Salted nuts and seeds. Canned beans with added salt. Canned or smoked fish. Whole eggs or egg yolks. Chicken or [place] with skin. Dairy Whole or 2% milk, cream, and half-and-half. Whole or full-fat cream cheese. Whole-fat or sweetened yogurt. Full-fat cheese. Nondairy creamers. Whipped toppings. Processed cheese and cheese spreads. Fats and oils Butter. Stick margarine. Lard. Shortening. Ghee. Bacon fat. Tropical oils, such as coconut, palm kernel, or palm oil. Seasoning and other foods Salted popcorn and pretzels. Onion salt, garlic salt, seasoned salt, table salt, and sea salt. Worcestershire sauce. Tartar sauce. Barbecue sauce. Teriyaki sauce. Soy sauce, including  reduced-sodium. Steak sauce. Canned and packaged gravies. Fish sauce. Oyster sauce. Cocktail sauce. Horseradish that you find on the shelf. Ketchup. Mustard. Meat flavorings and tenderizers. Bouillon cubes. Hot sauce and Tabasco sauce. Premade or packaged marinades. Premade or packaged taco seasonings. Relishes. Regular salad dressings. Where to find more information:  National Heart, Lung, and Blood Institute: PopSteam.is  American Heart Association: www.heart.org Summary  The DASH eating plan is a healthy eating plan that has been shown to reduce  high blood pressure (hypertension). It may also reduce your risk for type 2 diabetes, heart disease, and stroke.  With the DASH eating plan, you should limit salt (sodium) intake to 2,300 mg a day. If you have hypertension, you may need to reduce your sodium intake to 1,500 mg a day.  When on the DASH eating plan, aim to eat more fresh fruits and vegetables, whole grains, lean proteins, low-fat dairy, and heart-healthy fats.  Work with your health care provider or diet and nutrition specialist (dietitian) to adjust your eating plan to your individual calorie needs. This information is not intended to replace advice given to you by your health care provider. Make sure you discuss any questions you have with your health care provider. Document Released: 08/10/2011 Document Revised: 08/14/2016 Document Reviewed: 08/14/2016 Elsevier Interactive Patient Education  [employer].

## 2018-07-15 NOTE — Progress Notes (Signed)
HPI:                                                                Carlos Valentine is a 62 y.o. male who presents to [facility] Carlos Valentine: Primary Care Sports Medicine today for elevated blood pressure  BP Readings from Last 3 Encounters:  07/15/18 (!) 149/88  07/01/18 (!) 154/104  05/02/18 (!) 149/100    States "wife made me come here because I made the mistake of telling her my blood pressure was high." He currently smokes 1/2 ppd, down from 1.5 ppd on Chantix. Reports he has been smoking for over 40 years.  He reports occasional blurred vision and dizziness. Denies chest pain with exertion, orthopnea, lightheadedness, syncope and edema  Risk factors include: tobacco use, obesity, male sex   Past Medical History:  Diagnosis Date  . Hyperlipidemia   . Hypertension   . Obesity   . Tobacco use    History reviewed. No pertinent surgical history. Social History   Tobacco Use  . Smoking status: Current Every Day Smoker    Packs/day: 1.50    Years: 45.00    Pack years: 67.50    Types: Cigarettes  . Smokeless tobacco: Never Used  . Tobacco comment: as of 07/15/18 0.5 ppd  Substance Use Topics  . Alcohol use: Never    Alcohol/week: 0.0 standard drinks    Frequency: Never   family history is not on file.    ROS: negative except as noted in the HPI  Medications: Current Outpatient Medications  Medication Sig Dispense Refill  . amLODipine (NORVASC) 10 MG tablet Take 0.5 tablets (5 mg total) by mouth daily. Increase to 1 tab PO QD if BP is not at goal <130/80 30 tablet 2  . aspirin 81 MG tablet Take 81 mg by mouth daily.    Marland Kitchen atorvastatin (LIPITOR) 20 MG tablet Take 1 tablet (20 mg total) by mouth daily. 90 tablet 1  . sildenafil (REVATIO) 20 MG tablet Take 1-5 tablets (20-100 mg total) by mouth as needed. Take 1 hour prior to sex. Use lowest effective dose 30 tablet 3  . traMADol (ULTRAM) 50 MG tablet Take 1 tablet (50 mg total) by mouth every 6 (six) hours  as needed. 10 tablet 0  . varenicline (CHANTIX CONTINUING MONTH PAK) 1 MG tablet Take 1 tablet (1 mg total) by mouth 2 (two) times daily. 60 tablet 2   No current facility-administered medications for this visit.    No Known Allergies     Objective:  BP (!) 149/88   Pulse 88   Resp 14   Wt 199 lb (90.3 kg)   SpO2 94%   BMI 32.12 kg/m  Gen:  alert, not ill-appearing, no distress, appropriate for age, obese male HEENT: head normocephalic without obvious abnormality, conjunctiva and cornea clear, poor dentition, trachea midline Pulm: Normal work of breathing, normal phonation, breath sounds are diffusely diminished CV: Normal rate, regular rhythm, s1 and s2 distinct, no murmurs, clicks or rubs  Neuro: alert and oriented x 3, no tremor MSK: extremities atraumatic, normal gait and station, no peripheral edema Skin: intact, no rashes on exposed skin, no jaundice, no cyanosis Psych: well-groomed, cooperative, good eye contact, euthymic mood, affect mood-congruent, speech is articulate, and thought processes  clear and goal-directed    No results found for this or any previous visit (from the past 72 hour(s)). No results found.    Assessment and Plan: 62 y.o. male with   .Carlos Valentine was seen today for hypertension.  Diagnoses and all orders for this visit:  Hypertension goal BP (blood pressure) < 130/80 -     COMPLETE METABOLIC PANEL WITH GFR -     amLODipine (NORVASC) 10 MG tablet; Take 0.5 tablets (5 mg total) by mouth daily. Increase to 1 tab PO QD if BP is not at goal <130/80  Screening for lipid disorders -     Lipid Panel w/reflex Direct LDL  Tobacco use disorder  Smokers' cough (HCC)  Mixed hyperlipidemia -     atorvastatin (LIPITOR) 20 MG tablet; Take 1 tablet (20 mg total) by mouth daily.  Other orders -     Discontinue: amLODipine (NORVASC) 10 MG tablet; Take 0.5 tablets (5 mg total) by mouth daily. Increase to 1 tab PO QD if BP is not at goal <130/80   HTN -  labs drawn in office today to check renal function and lipids - multiple CVD risk factors incl.tobacco use, obesity, male sex, age>55 - starting Amlodipine, patient instructed to self-titrate to goal BP <130/80. Wife will monitor his BP at home - baby asa for primary prevention - cont Chantix and cont to reduce number of cigarettes/day - counseled on therapeutic lifestyle changes, DASH eating plan  Patient education and anticipatory guidance given Patient agrees with treatment plan Follow-up in 1-2 months or sooner as needed if symptoms worsen or fail to improve  Levonne Hubert PA-C

## 2018-07-16 ENCOUNTER — Encounter: Payer: Self-pay | Admitting: Physician Assistant

## 2018-07-16 LAB — COMPLETE METABOLIC PANEL WITH GFR
AG Ratio: 1.5 (calc) (ref 1.0–2.5)
ALKALINE PHOSPHATASE (APISO): 80 U/L (ref 40–115)
ALT: 13 U/L (ref 9–46)
AST: 16 U/L (ref 10–35)
Albumin: 4.3 g/dL (ref 3.6–5.1)
BUN: 18 mg/dL (ref 7–25)
CALCIUM: 9.9 mg/dL (ref 8.6–10.3)
CO2: 27 mmol/L (ref 20–32)
CREATININE: 1.13 mg/dL (ref 0.70–1.25)
Chloride: 100 mmol/L (ref 98–110)
GFR, EST AFRICAN AMERICAN: 81 mL/min/{1.73_m2} (ref >=60)
GFR, EST NON AFRICAN AMERICAN: 70 mL/min/{1.73_m2} (ref >=60)
GLOBULIN: 2.9 g/dL (ref 1.9–3.7)
GLUCOSE: 99 mg/dL (ref 65–99)
Potassium: 4.8 mmol/L (ref 3.5–5.3)
SODIUM: 136 mmol/L (ref 135–146)
TOTAL PROTEIN: 7.2 g/dL (ref 6.1–8.1)
Total Bilirubin: 0.2 mg/dL (ref 0.2–1.2)

## 2018-07-16 LAB — LIPID PANEL W/REFLEX DIRECT LDL
CHOL/HDL RATIO: 4.9 (calc) (ref <5.0)
CHOLESTEROL: 242 mg/dL — AB (ref <200)
HDL: 49 mg/dL (ref >40)
LDL CHOLESTEROL (CALC): 169 mg/dL — AB
NON-HDL CHOLESTEROL (CALC): 193 mg/dL — AB (ref <130)
Triglycerides: 111 mg/dL (ref <150)

## 2018-07-16 MED ORDER — ATORVASTATIN CALCIUM 20 MG PO TABS: 20.0000 mg | ORAL_TABLET | Freq: Every day | ORAL | 1 refills | Status: DC

## 2018-07-16 NOTE — Progress Notes (Signed)
Labs show high cholesterol. I recommend he start cholesterol-lowering medication. Sending in prescription for atorvastatin 20 mg daily.  Common side effects are upset stomach, nausea, diarrhea.  He should inform our office if he experiences any muscle ache. In addition to medication I also recommend that he adjust his diet.  He was given information on Dash diet and this should help with lowering cholesterol.  Would also like him to increase his aerobic exercise.  Try to get at least 30 minutes of brisk walking daily

## 2018-07-21 ENCOUNTER — Encounter: Payer: Self-pay | Admitting: Physician Assistant

## 2018-07-21 DIAGNOSIS — I10 Essential (primary) hypertension: Secondary | ICD-10-CM | POA: Insufficient documentation

## 2018-07-21 DIAGNOSIS — J410 Simple chronic bronchitis: Secondary | ICD-10-CM | POA: Insufficient documentation

## 2018-07-21 DIAGNOSIS — E782 Mixed hyperlipidemia: Secondary | ICD-10-CM | POA: Insufficient documentation

## 2018-10-12 ENCOUNTER — Other Ambulatory Visit: Payer: Self-pay | Admitting: Physician Assistant

## 2018-10-12 DIAGNOSIS — I10 Essential (primary) hypertension: Secondary | ICD-10-CM

## 2018-10-18 ENCOUNTER — Ambulatory Visit: Payer: 59 | Admitting: Physician Assistant

## 2018-10-18 ENCOUNTER — Encounter: Payer: Self-pay | Admitting: Physician Assistant

## 2018-10-18 VITALS — BP 134/79 | HR 95 | Ht 65.0 in | Wt 204.0 lb

## 2018-10-18 DIAGNOSIS — R351 Nocturia: Secondary | ICD-10-CM | POA: Diagnosis not present

## 2018-10-18 DIAGNOSIS — E782 Mixed hyperlipidemia: Secondary | ICD-10-CM

## 2018-10-18 DIAGNOSIS — I10 Essential (primary) hypertension: Secondary | ICD-10-CM

## 2018-10-18 DIAGNOSIS — Z122 Encounter for screening for malignant neoplasm of respiratory organs: Secondary | ICD-10-CM | POA: Diagnosis not present

## 2018-10-18 DIAGNOSIS — R81 Glycosuria: Secondary | ICD-10-CM

## 2018-10-18 DIAGNOSIS — F17200 Nicotine dependence, unspecified, uncomplicated: Secondary | ICD-10-CM

## 2018-10-18 LAB — POCT URINALYSIS DIPSTICK
Bilirubin, UA: NEGATIVE
Glucose, UA: POSITIVE — AB
LEUKOCYTES UA: NEGATIVE
Nitrite, UA: NEGATIVE
Protein, UA: NEGATIVE
Urobilinogen, UA: 0.2 E.U./dL
pH, UA: 5.5 (ref 5.0–8.0)

## 2018-10-18 MED ORDER — AMLODIPINE BESYLATE 10 MG PO TABS: 10.0000 mg | ORAL_TABLET | Freq: Every day | ORAL | 1 refills | Status: DC

## 2018-10-18 NOTE — Patient Instructions (Addendum)
For nighttime urination Cut back on caffeine Limit to 1 cup of coffee and 1 glass of green tea per day No caffeine after 2 pm  For your blood pressure: - Goal <130/80 (Ideally 120's/70's) - take your blood pressure medication in the morning (unless instructed differently) - take baby aspirin 81 mg daily to help prevent heart attack/stroke - monitor and log blood pressures at home - check around the same time each day in a relaxed setting - Limit salt to <2500 mg/day - Follow DASH (Dietary Approach to Stopping Hypertension) eating plan - Try to get at least 150 minutes of aerobic exercise per week - Aim to go on a brisk walk 30 minutes per day at least 5 days per week. If you're not active, gradually increase how long you walk by 5 minutes each week - limit alcohol: 2 standard drinks per day for men and 1 per day for women - avoid tobacco/nicotine products. Consider smoking cessation if you smoke - weight loss: 7% of current body weight can reduce your blood pressure by 5-10 points - follow-up at least every 6 months for your blood pressure. Follow-up sooner if your BP is not controlled   Urinary Frequency, Adult Urinary frequency means urinating more often than usual. You may urinate every 1-2 hours even though you drink a normal amount of fluid and do not have a bladder infection or condition. Although you urinate more often than normal, the total amount of urine produced in a day is normal. With urinary frequency, you may have an urgent need to urinate often. The stress and anxiety of needing to find a bathroom quickly can make this urge worse. This condition may go away on its own or you may need treatment at home. Home treatment may include bladder training, exercises, taking medicines, or making changes to your diet. Follow these instructions at home: Bladder health   Keep a bladder diary if told by your health care provider. Keep track of: ? What you eat and drink. ? How often you  urinate. ? How much you urinate.  Follow a bladder training program if told by your health care provider. This may include: ? Learning to delay going to the bathroom. ? Double urinating (voiding). This helps if you are not completely emptying your bladder. ? Scheduled voiding.  Do Kegel exercises as told by your health care provider. Kegel exercises strengthen the muscles that help control urination, which may help the condition. Eating and drinking  If told by your health care provider, make diet changes, such as: ? Avoiding caffeine. ? Drinking fewer fluids, especially alcohol. ? Not drinking in the evening. ? Avoiding foods or drinks that may irritate the bladder. These include coffee, tea, soda, artificial sweeteners, citrus, tomato-based foods, and chocolate. ? Eating foods that help prevent or ease constipation. Constipation can make this condition worse. Your health care provider may recommend that you:  Drink enough fluid to keep your urine pale yellow.  Take over-the-counter or prescription medicines.  Eat foods that are high in fiber, such as beans, whole grains, and fresh fruits and vegetables.  Limit foods that are high in fat and processed sugars, such as fried or sweet foods. General instructions  Take over-the-counter and prescription medicines only as told by your health care provider.  Keep all follow-up visits as told by your health care provider. This is important. Contact a health care provider if:  You start urinating more often.  You feel pain or irritation when you  urinate.  You notice blood in your urine.  Your urine looks cloudy.  You develop a fever.  You begin vomiting. Get help right away if:  You are unable to urinate. Summary  Urinary frequency means urinating more often than usual. With urinary frequency, you may urinate every 1-2 hours even though you drink a normal amount of fluid and do not have a bladder infection or other bladder  condition.  Your health care provider may recommend that you keep a bladder diary, follow a bladder training program, or make dietary changes.  If told by your health care provider, do Kegel exercises to strengthen the muscles that help control urination.  Take over-the-counter and prescription medicines only as told by your health care provider.  Contact a health care provider if your symptoms do not improve or get worse. This information is not intended to replace advice given to you by your health care provider. Make sure you discuss any questions you have with your health care provider. Document Released: 06/17/2009 Document Revised: 02/28/2018 Document Reviewed: 02/28/2018 Elsevier Interactive Patient Education  2019 [employer].

## 2018-10-18 NOTE — Progress Notes (Signed)
HPI:                                                                Carlos Valentine is a 63 y.o. male who presents to [facility]: Primary Care Sports Medicine today for follow-up  HTN: diagnosed 3 months ago. Taking Amlodipine 10 mg daily. Compliant with medications. Does not checks BP's at home.  He stopped Chantix because he reports he was getting dizzy sometimes. Currently smoking 1/2 ppd. Declines smoking cessation.  Denies vision change, headache, chest pain with exertion, orthopnea, lightheadedness, syncope and edema. Risk factors include: HLD, tobacco use, male sex, age>55, obesity  HLD: started on Atorvastatin 3 months ago. Compliant with medications. Denies myalgias or GI upset.  Complains of nocturia 1-3 times nightly for the last 1-2 months. Admits to drinking nearly a gallon of green tea mixed with pomegranate juice throughout the day, 2 cups of coffee in the morning and 1 glass of soda with dinner. He states when he takes Benadryl at night he does not wake up to urinate.     IPSS Questionnaire (AUA-7): Over the past month.   1)  How often have you had a sensation of not emptying your bladder completely after you finish urinating?  0 - Not at all  2)  How often have you had to urinate again less than two hours after you finished urinating? 1 - Less than 1 time in 5  3)  How often have you found you stopped and started again several times when you urinated?  0 - Not at all  4) How difficult have you found it to postpone urination?  0 - Not at all  5) How often have you had a weak urinary stream?  0 - Not at all  6) How often have you had to push or strain to begin urination?  0 - Not at all  7) How many times did you most typically get up to urinate from the time you went to bed until the time you got up in the morning?  2 - None  Total score:  0-7 mildly symptomatic   8-19 moderately symptomatic   20-35 severely symptomatic    Depression screen [facility]  2/9 11/16/2016  Decreased Interest 0  Down, Depressed, Hopeless 0  PHQ - 2 Score 0  Altered sleeping 0  Tired, decreased energy 0  Change in appetite 0  Feeling bad or failure about yourself  0  Trouble concentrating 0  Moving slowly or fidgety/restless 0  Suicidal thoughts 0  PHQ-9 Score 0    No flowsheet data found.    Past Medical History:  Diagnosis Date  . Hyperlipidemia   . Hypertension   . Obesity   . Tobacco use    History reviewed. No pertinent surgical history. Social History   Tobacco Use  . Smoking status: Current Every Day Smoker    Packs/day: 1.50    Years: 45.00    Pack years: 67.50    Types: Cigarettes  . Smokeless tobacco: Never Used  . Tobacco comment: as of 07/15/18 0.5 ppd  Substance Use Topics  . Alcohol use: Never    Alcohol/week: 0.0 standard drinks    Frequency: Never   family history is not on file.  ROS: negative except as noted in the HPI  Medications: Current Outpatient Medications  Medication Sig Dispense Refill  . amLODipine (NORVASC) 10 MG tablet Take 1 tablet (10 mg total) by mouth daily. 90 tablet 1  . aspirin 81 MG tablet Take 81 mg by mouth daily.    Marland Kitchen. atorvastatin (LIPITOR) 20 MG tablet Take 1 tablet (20 mg total) by mouth daily. 90 tablet 1  . varenicline (CHANTIX CONTINUING MONTH PAK) 1 MG tablet Take 1 tablet (1 mg total) by mouth 2 (two) times daily. (Patient not taking: Reported on 10/18/2018) 60 tablet 2   No current facility-administered medications for this visit.    No Known Allergies     Objective:  BP 134/79   Pulse 95   Ht 5\' 5"  (1.651 m)   Wt 204 lb (92.5 kg)   SpO2 96%   BMI 33.95 kg/m  Gen:  alert, not ill-appearing, no distress, appropriate for age, obese male HEENT: head normocephalic without obvious abnormality, conjunctiva and cornea clear, trachea midline Pulm: Normal work of breathing, normal phonation, decreased air movement, no wheezes, rales or rhonchi CV: Normal rate, regular rhythm,  s1 and s2 distinct, no murmurs, clicks or rubs  Neuro: alert and oriented x 3, no tremor MSK: extremities atraumatic, normal gait and station Skin: intact, no rashes on exposed skin, no jaundice, no cyanosis  Lab Results  Component Value Date   CREATININE 1.13 07/15/2018   BUN 18 07/15/2018   NA 136 07/15/2018   K 4.8 07/15/2018   CL 100 07/15/2018   CO2 27 07/15/2018   Lab Results  Component Value Date   ALT 13 07/15/2018   AST 16 07/15/2018   BILITOT 0.2 07/15/2018   Lab Results  Component Value Date   CHOL 242 (H) 07/15/2018   HDL 49 07/15/2018   LDLCALC 169 (H) 07/15/2018   TRIG 111 07/15/2018   CHOLHDL 4.9 07/15/2018    The 82-NFAO10-year ASCVD risk score Denman George(Goff DC Jr., et al., 2013) is: 21.2%   Values used to calculate the score:     Age: [age] years     Sex: Male     Is Non-Hispanic African American: No     Diabetic: No     Tobacco smoker: Yes     Systolic Blood Pressure: 134 mmHg     Is BP treated: Yes     HDL Cholesterol: 49 mg/dL     Total Cholesterol: 242 mg/dL   Results for orders placed or performed in visit on 10/18/18 (from the past 72 hour(s))  POCT urinalysis dipstick     Status: Abnormal   Collection Time: 10/18/18  4:32 PM  Result Value Ref Range   Color, UA yellow    Clarity, UA clear    Glucose, UA Positive (A) Negative    Comment: 100mg /dl   Bilirubin, UA negative    Ketones, UA trace    Spec Grav, UA >=1.030 (A) 1.010 - 1.025   Blood, UA trace    pH, UA 5.5 5.0 - 8.0   Protein, UA Negative Negative   Urobilinogen, UA 0.2 0.2 or 1.0 E.U./dL   Nitrite, UA negative    Leukocytes, UA Negative Negative   Appearance yellow    Odor none    No results found.    Assessment and Plan: 63 y.o. male with   .Carlos Valentine was seen today for follow-up.  Diagnoses and all orders for this visit:  Hypertension goal BP (blood pressure) < 130/80 -  amLODipine (NORVASC) 10 MG tablet; Take 1 tablet (10 mg total) by mouth daily.  Nocturia -     POCT  urinalysis dipstick  Encounter for screening for malignant neoplasm of respiratory organs -     CT CHEST LUNG CA SCREEN LOW DOSE W/O CM; Future  Mixed hyperlipidemia Comments: 10-yr ASCVD risk 21.2%. Atorvastatin 20 mg and ASA 81 mg  Tobacco use disorder  HTN BP in range in office today Cont current medications Baby asa for primary prevention   Nocturia Patient was unable to void during his visit and UA was completed later UA personally reviewed by me and positive for glucose Patient was contacted by LPN and instructed to return for office visit and A1C next week There is also a component likely due to diuresis from excessive caffeine consumption. Patient was counseled to limit caffeine to 1-2 standard cups per day and no caffeine in the afternoon  Reviewed Health Maintenance Lung cancer screening ordered  Patient education and anticipatory guidance given Patient agrees with treatment plan Follow-up in 3 days for glucosuria as needed if symptoms worsen or fail to improve  Levonne Hubert PA-C

## 2018-10-20 ENCOUNTER — Encounter: Payer: Self-pay | Admitting: Physician Assistant

## 2018-10-20 DIAGNOSIS — R81 Glycosuria: Secondary | ICD-10-CM | POA: Insufficient documentation

## 2018-10-21 ENCOUNTER — Telehealth: Payer: Self-pay | Admitting: Physician Assistant

## 2018-10-21 DIAGNOSIS — J410 Simple chronic bronchitis: Secondary | ICD-10-CM

## 2018-10-21 DIAGNOSIS — F17200 Nicotine dependence, unspecified, uncomplicated: Secondary | ICD-10-CM

## 2018-10-21 NOTE — Telephone Encounter (Signed)
Left VM for Pt with status update.  

## 2018-10-21 NOTE — Telephone Encounter (Signed)
Ordered

## 2018-10-21 NOTE — Telephone Encounter (Signed)
Before CT can be processed, chest xray must be done. Routing for review.

## 2018-10-22 NOTE — Progress Notes (Signed)
Left vm #2 1:10 pm 10/22/18 Patient has sugar in his urine and will need to return for A1C and visit with me in case I need to start diabetes medication Please contact patient to schedule

## 2018-11-11 ENCOUNTER — Ambulatory Visit (INDEPENDENT_AMBULATORY_CARE_PROVIDER_SITE_OTHER): Payer: 59 | Admitting: Physician Assistant

## 2018-11-11 ENCOUNTER — Encounter: Payer: Self-pay | Admitting: Physician Assistant

## 2018-11-11 VITALS — BP 151/83 | HR 99 | Wt 206.0 lb

## 2018-11-11 DIAGNOSIS — R81 Glycosuria: Secondary | ICD-10-CM | POA: Diagnosis not present

## 2018-11-11 DIAGNOSIS — I10 Essential (primary) hypertension: Secondary | ICD-10-CM | POA: Diagnosis not present

## 2018-11-11 DIAGNOSIS — R7303 Prediabetes: Secondary | ICD-10-CM | POA: Diagnosis not present

## 2018-11-11 DIAGNOSIS — R351 Nocturia: Secondary | ICD-10-CM | POA: Diagnosis not present

## 2018-11-11 DIAGNOSIS — R609 Edema, unspecified: Secondary | ICD-10-CM

## 2018-11-11 LAB — POCT GLYCOSYLATED HEMOGLOBIN (HGB A1C): HbA1c, POC (prediabetic range): 6.2 % (ref 5.7–6.4)

## 2018-11-11 MED ORDER — AMLODIPINE BESYLATE 10 MG PO TABS: 5.0000 mg | ORAL_TABLET | Freq: Every day | ORAL | 1 refills | Status: DC

## 2018-11-11 NOTE — Patient Instructions (Addendum)
Prediabetes  Prediabetes is the condition of having a blood sugar (blood glucose) level that is higher than it should be, but not high enough for you to be diagnosed with type 2 diabetes. Having prediabetes puts you at risk for developing type 2 diabetes (type 2 diabetes mellitus). Prediabetes may be called impaired glucose tolerance or impaired fasting glucose.  Prediabetes usually does not cause symptoms. Your health care provider can diagnose this condition with blood tests. You may be tested for prediabetes if you are overweight and if you have at least one other risk factor for prediabetes.  What is blood glucose, and how is it measured?  Blood glucose refers to the amount of glucose in your bloodstream. Glucose comes from eating foods that contain sugars and starches (carbohydrates), which the body breaks down into glucose. Your blood glucose level may be measured in mg/dL (milligrams per deciliter) or mmol/L (millimoles per liter). Your blood glucose may be checked with one or more of the following blood tests:  A fasting blood glucose (FBG) test. You will not be allowed to eat (you will fast) for 8 hours or longer before a blood sample is taken.  A normal range for FBG is 70-100 mg/dl (3.9-5.6 mmol/L).  An A1c (hemoglobin A1c) blood test. This test provides information about blood glucose control over the previous 2?3 months.  An oral glucose tolerance test (OGTT). This test measures your blood glucose at two times:  After fasting. This is your baseline level.  Two hours after you drink a beverage that contains glucose.  You may be diagnosed with prediabetes:  If your FBG is 100?125 mg/dL (5.6-6.9 mmol/L).  If your A1c level is 5.7?6.4%.  If your OGTT result is 140?199 mg/dL (7.8-11 mmol/L).  These blood tests may be repeated to confirm your diagnosis.  How can this condition affect me?  The pancreas produces a hormone (insulin) that helps to move glucose from the bloodstream into cells. When cells in the  body do not respond properly to insulin that the body makes (insulin resistance), excess glucose builds up in the blood instead of going into cells. As a result, high blood glucose (hyperglycemia) can develop, which can cause many complications. Hyperglycemia is a symptom of prediabetes.  Having high blood glucose for a long time is dangerous. Too much glucose in your blood can damage your nerves and blood vessels. Long-term damage can lead to complications from diabetes, which may include:  Heart disease.  Stroke.  Blindness.  Kidney disease.  Depression.  Poor circulation in the feet and legs, which could lead to surgical removal (amputation) in severe cases.  What can increase my risk?  Risk factors for prediabetes include:  Having a family member with type 2 diabetes.  Being overweight or obese.  Being older than age 45.  Being of American Indian, African-American, Hispanic/Latino, or Asian/Pacific Islander descent.  Having an inactive (sedentary) lifestyle.  Having a history of heart disease.  History of gestational diabetes or polycystic ovary syndrome (PCOS), in women.  Having low levels of good cholesterol (HDL-C) or high levels of blood fats (triglycerides).  Having high blood pressure.  What actions can I take to prevent diabetes?         Be physically active.  Do moderate-intensity physical activity for 30 or more minutes on 5 or more days of the week, or as much as told by your health care provider. This could be brisk walking, biking, or water aerobics.  Ask your health care provider   what activities are safe for you. A mix of physical activities may be best, such as walking, swimming, cycling, and strength training.  Lose weight as told by your health care provider.  Losing 5-7% of your body weight can reverse insulin resistance.  Your health care provider can determine how much weight loss is best for you and can help you lose weight safely.  Follow a healthy meal plan. This includes eating lean  proteins, complex carbohydrates, fresh fruits and vegetables, low-fat dairy products, and healthy fats.  Follow instructions from your health care provider about eating or drinking restrictions.  Make an appointment to see a diet and nutrition specialist (registered dietitian) to help you create a healthy eating plan that is right for you.  Do not smoke or use any tobacco products, such as cigarettes, chewing tobacco, and e-cigarettes. If you need help quitting, ask your health care provider.  Take over-the-counter and prescription medicines as told by your health care provider. You may be prescribed medicines that help lower the risk of type 2 diabetes.  Keep all follow-up visits as told by your health care provider. This is important.  Summary  Prediabetes is the condition of having a blood sugar (blood glucose) level that is higher than it should be, but not high enough for you to be diagnosed with type 2 diabetes.  Having prediabetes puts you at risk for developing type 2 diabetes (type 2 diabetes mellitus).  To help prevent type 2 diabetes, make lifestyle changes such as being physically active and eating a healthy diet. Lose weight as told by your health care provider.  This information is not intended to replace advice given to you by your health care provider. Make sure you discuss any questions you have with your health care provider.  Document Released: 12/13/2015 Document Revised: 04/10/2017 Document Reviewed: 10/12/2015  Elsevier Interactive Patient Education  2019 Elsevier Inc[employer]    Prediabetes Eating Plan  Prediabetes is a condition that causes blood sugar (glucose) levels to be higher than normal. This increases the risk for developing diabetes. In order to prevent diabetes from developing, your health care provider may recommend a diet and other lifestyle changes to help you:  Control your blood glucose levels.  Improve your cholesterol levels.  Manage your blood pressure.  Your health care provider  may recommend working with a diet and nutrition specialist (dietitian) to make a meal plan that is best for you.  What are tips for following this plan?  Lifestyle  Set weight loss goals with the help of your health care team. It is recommended that most people with prediabetes lose 7% of their current body weight.  Exercise for at least 30 minutes at least 5 days a week.  Attend a support group or seek ongoing support from a mental health counselor.  Take over-the-counter and prescription medicines only as told by your health care provider.  Reading food labels  Read food labels to check the amount of fat, salt (sodium), and sugar in prepackaged foods. Avoid foods that have:  Saturated fats.  Trans fats.  Added sugars.  Avoid foods that have more than 300 milligrams (mg) of sodium per serving. Limit your daily sodium intake to less than 2,300 mg each day.  Shopping  Avoid buying pre-made and processed foods.  Cooking  Cook with olive oil. Do not use butter, lard, or ghee.  Bake, broil, grill, or boil foods. Avoid frying.  Meal planning    Work with your dietitian to   develop an eating plan that is right for you. This may include:  Tracking how many calories you take in. Use a food diary, notebook, or mobile application to track what you eat at each meal.  Using the glycemic index (GI) to plan your meals. The index tells you how quickly a food will raise your blood glucose. Choose low-GI foods. These foods take a longer time to raise blood glucose.  Consider following a Mediterranean diet. This diet includes:  Several servings each day of fresh fruits and vegetables.  Eating fish at least twice a week.  Several servings each day of whole grains, beans, nuts, and seeds.  Using olive oil instead of other fats.  Moderate alcohol consumption.  Eating small amounts of red meat and whole-fat dairy.  If you have high blood pressure, you may need to limit your sodium intake or follow a diet such as the DASH eating plan. DASH  is an eating plan that aims to lower high blood pressure.  What foods are recommended?  The items listed below may not be a complete list. Talk with your dietitian about what dietary choices are best for you.  Grains  Whole grains, such as whole-wheat or whole-grain breads, crackers, cereals, and pasta. Unsweetened oatmeal. Bulgur. Barley. Quinoa. Brown rice. Corn or whole-wheat flour tortillas or taco shells.  Vegetables  Lettuce. Spinach. Peas. Beets. Cauliflower. Cabbage. Broccoli. Carrots. Tomatoes. Squash. Eggplant. Herbs. Peppers. Onions. Cucumbers. Brussels sprouts.  Fruits  Berries. Bananas. Apples. Oranges. Grapes. Papaya. Mango. Pomegranate. Kiwi. Grapefruit. Cherries.  Meats and other protein foods  Seafood. Poultry without skin. Lean cuts of pork and beef. Tofu. Eggs. Nuts. Beans.  Dairy  Low-fat or fat-free dairy products, such as yogurt, cottage cheese, and cheese.  Beverages  Water. Tea. Coffee. Sugar-free or diet soda. Seltzer water. Lowfat or no-fat milk. Milk alternatives, such as soy or almond milk.  Fats and oils  Olive oil. Canola oil. Sunflower oil. Grapeseed oil. Avocado. Walnuts.  Sweets and desserts  Sugar-free or low-fat pudding. Sugar-free or low-fat ice cream and other frozen treats.  Seasoning and other foods  Herbs. Sodium-free spices. Mustard. Relish. Low-fat, low-sugar ketchup. Low-fat, low-sugar barbecue sauce. Low-fat or fat-free mayonnaise.  What foods are not recommended?  The items listed below may not be a complete list. Talk with your dietitian about what dietary choices are best for you.  Grains  Refined white flour and flour products, such as bread, pasta, snack foods, and cereals.  Vegetables  Canned vegetables. Frozen vegetables with butter or cream sauce.  Fruits  Fruits canned with syrup.  Meats and other protein foods  Fatty cuts of meat. Poultry with skin. Breaded or fried meat. Processed meats.  Dairy  Full-fat yogurt, cheese, or milk.  Beverages  Sweetened drinks,  such as sweet iced tea and soda.  Fats and oils  Butter. Lard. Ghee.  Sweets and desserts  Baked goods, such as cake, cupcakes, pastries, cookies, and cheesecake.  Seasoning and other foods  Spice mixes with added salt. Ketchup. Barbecue sauce. Mayonnaise.  Summary  To prevent diabetes from developing, you may need to make diet and other lifestyle changes to help control blood sugar, improve cholesterol levels, and manage your blood pressure.  Set weight loss goals with the help of your health care team. It is recommended that most people with prediabetes lose 7 percent of their current body weight.  Consider following a Mediterranean diet that includes plenty of fresh fruits and vegetables, whole grains, beans, nuts,   seeds, fish, lean meat, low-fat dairy, and healthy oils.  This information is not intended to replace advice given to you by your health care provider. Make sure you discuss any questions you have with your health care provider.  Document Released: 01/05/2015 Document Revised: 10/25/2016 Document Reviewed: 10/25/2016  Elsevier Interactive Patient Education  2019 Elsevier Inc.

## 2018-11-11 NOTE — Progress Notes (Signed)
HPI:                                                                Carlos Valentine is a 63 y.o. male who presents to [facility]: Primary Care Sports Medicine today for follow-up glucosuria  Carlos Valentine initially presented on 10/18/18 for urinary frequency. POC UA was positive for glucose. Today he denies polydipsia or polyuria. He still gets up to urinate at least once per night. Denies personal or family hx of diabetes.   Reports bilateral leg swelling beginning this week on Sunday, worse on the left side. He states he held his Amlodipine for a couple of days and the swelling improved. He has never had peripheral edema before. Denies orthopnea, PND, DOE.   Past Medical History:  Diagnosis Date  . Hyperlipidemia   . Hypertension   . Obesity   . Tobacco use    History reviewed. No pertinent surgical history. Social History   Tobacco Use  . Smoking status: Current Every Day Smoker    Packs/day: 1.50    Years: 45.00    Pack years: 67.50    Types: Cigarettes  . Smokeless tobacco: Never Used  . Tobacco comment: as of 07/15/18 0.5 ppd  Substance Use Topics  . Alcohol use: Never    Alcohol/week: 0.0 standard drinks    Frequency: Never   family history is not on file.    Review of Systems  Cardiovascular: Positive for leg swelling.     Medications: Current Outpatient Medications  Medication Sig Dispense Refill  . amLODipine (NORVASC) 10 MG tablet Take 0.5 tablets (5 mg total) by mouth daily. 90 tablet 1  . aspirin 81 MG tablet Take 81 mg by mouth daily.    Marland Kitchen atorvastatin (LIPITOR) 20 MG tablet Take 1 tablet (20 mg total) by mouth daily. 90 tablet 1   No current facility-administered medications for this visit.    No Known Allergies     Objective:  BP (!) 151/83   Pulse 99   Wt 206 lb (93.4 kg)   BMI 34.28 kg/m  Vitals:   11/11/18 1524 11/11/18 1549  BP: (!) 153/97 (!) 151/83  Pulse: 99    Gen:  alert, not ill-appearing, no distress,  appropriate for age HEENT: head normocephalic without obvious abnormality, conjunctiva and cornea clear, trachea midline Pulm: Normal work of breathing, normal phonation, clear to auscultation bilaterally, no wheezes, rales or rhonchi CV: Normal rate, regular rhythm, s1 and s2 distinct, no murmurs, clicks or rubs  Neuro: alert and oriented x 3, no tremor MSK: extremities atraumatic, normal gait and station, 1+ peripheral edema bilaterally Skin: intact, no rashes on exposed skin, no jaundice, no cyanosis   BP Readings from Last 3 Encounters:  11/11/18 (!) 151/83  10/18/18 134/79  07/15/18 (!) 149/88     Results for orders placed or performed in visit on 11/11/18 (from the past 72 hour(s))  POCT HgB A1C     Status: Abnormal   Collection Time: 11/11/18  3:29 PM  Result Value Ref Range   Hemoglobin A1C     HbA1c POC (<> result, manual entry)     HbA1c, POC (prediabetic range) 6.2 5.7 - 6.4 %   HbA1c, POC (controlled diabetic range)     No  results found.  Lab Results  Component Value Date   CREATININE 1.13 07/15/2018   BUN 18 07/15/2018   NA 136 07/15/2018   K 4.8 07/15/2018   CL 100 07/15/2018   CO2 27 07/15/2018     Assessment and Plan: 63 y.o. male with   .Diagnoses and all orders for this visit:  Prediabetes -     POCT HgB A1C  Glucosuria -     POCT HgB A1C -     Renal Profile with Estimated GFR -     Urinalysis w microscopic + reflex cultur -     Uric acid  Nocturia -     PSA  Hypertension goal BP (blood pressure) < 130/80 -     amLODipine (NORVASC) 10 MG tablet; Take 0.5 tablets (5 mg total) by mouth daily.  Peripheral edema  Other orders -     REFLEXIVE URINE CULTURE   Peripheral edema Suspect this is a side effect of the CCB. We will half his dose and see if it resolves/improves. Recommended he wear compression socks and elevate his legs  Glucosuria/Prediabetes A1C in prediabetic range Patient was counseled on prediabetes pathophysiology and  management, to include aggressive lifestyle changes. Strongly encouraged him to avoid juice, soda and sweetened beverages Renal function was normal approx 4 months ago. Rechecking today Repeat A1C in 6 months  HTN BP out of range on 2 checks in office today He admits to eating a salty lunch before coming here Decreasing Amlodipine to 5 mg due to peripheral edema  Counseled to limit sodium He will monitor and log his BP's at home If needed will add ACE/ARB. Avoiding thiazide due to urinary frequency Declines smoking cessation.  Patient education and anticipatory guidance given Patient agrees with treatment plan Follow-up in 2 weeks for nurse BP check as needed if symptoms worsen or fail to improve  Levonne Hubert PA-C

## 2018-11-12 LAB — PSA: PSA: 1.6 ng/mL (ref <=4.0)

## 2018-11-12 LAB — URINALYSIS W MICROSCOPIC + REFLEX CULTURE
BILIRUBIN URINE: NEGATIVE
Bacteria, UA: NONE SEEN /HPF
GLUCOSE, UA: NEGATIVE
HGB URINE DIPSTICK: NEGATIVE
HYALINE CAST: NONE SEEN /LPF
Ketones, ur: NEGATIVE
Leukocyte Esterase: NEGATIVE
Nitrites, Initial: NEGATIVE
PH: 6 (ref 5.0–8.0)
Protein, ur: NEGATIVE
RBC / HPF: NONE SEEN /HPF (ref 0–2)
SPECIFIC GRAVITY, URINE: 1.018 (ref 1.001–1.03)
SQUAMOUS EPITHELIAL / LPF: NONE SEEN /HPF (ref <=5)
WBC, UA: NONE SEEN /HPF (ref 0–5)

## 2018-11-12 LAB — RENAL PROFILE WITH ESTIMATED GFR
Albumin: 4.1 g/dL (ref 3.6–5.1)
BUN: 16 mg/dL (ref 7–25)
CALCIUM: 9.5 mg/dL (ref 8.6–10.3)
CO2: 25 mmol/L (ref 20–32)
CREATININE: 0.91 mg/dL (ref 0.70–1.25)
Chloride: 104 mmol/L (ref 98–110)
GFR, EST AFRICAN AMERICAN: 105 mL/min/{1.73_m2} (ref >=60)
GFR, EST NON AFRICAN AMERICAN: 91 mL/min/{1.73_m2} (ref >=60)
Glucose, Bld: 98 mg/dL (ref 65–99)
Phosphorus: 3.8 mg/dL (ref 2.5–4.5)
Potassium: 4.3 mmol/L (ref 3.5–5.3)
SODIUM: 138 mmol/L (ref 135–146)

## 2018-11-12 LAB — URIC ACID: Uric Acid, Serum: 3.4 mg/dL — ABNORMAL LOW (ref 4.0–8.0)

## 2018-11-12 LAB — NO CULTURE INDICATED

## 2018-11-20 ENCOUNTER — Telehealth: Payer: Self-pay | Admitting: Physician Assistant

## 2018-11-20 NOTE — Telephone Encounter (Signed)
Please re-schedule 3/23 appointment. Should be a nurse visit anyway, but we are postponing nurse visits at this time. Please schedule patient with me for May 2020 for blood pressure follow-up

## 2018-11-21 ENCOUNTER — Encounter: Payer: Self-pay | Admitting: Physician Assistant

## 2018-11-21 DIAGNOSIS — R609 Edema, unspecified: Secondary | ICD-10-CM | POA: Insufficient documentation

## 2018-11-21 NOTE — Telephone Encounter (Signed)
Left vm stating that appointment on 11/25/18 would be canceled.  I asked that he call back and reschedule for a later date in May.  Office contact info given. -EH/RMA

## 2018-11-25 ENCOUNTER — Ambulatory Visit: Payer: 59 | Admitting: Physician Assistant

## 2018-12-09 ENCOUNTER — Encounter: Payer: Self-pay | Admitting: Physician Assistant

## 2018-12-09 ENCOUNTER — Ambulatory Visit (INDEPENDENT_AMBULATORY_CARE_PROVIDER_SITE_OTHER): Payer: 59 | Admitting: Physician Assistant

## 2018-12-09 ENCOUNTER — Ambulatory Visit: Payer: 59 | Admitting: Physician Assistant

## 2018-12-09 VITALS — BP 135/91 | HR 93

## 2018-12-09 DIAGNOSIS — I10 Essential (primary) hypertension: Secondary | ICD-10-CM

## 2018-12-09 DIAGNOSIS — Z5181 Encounter for therapeutic drug level monitoring: Secondary | ICD-10-CM | POA: Diagnosis not present

## 2018-12-09 MED ORDER — AMLODIPINE BESYLATE 5 MG PO TABS: 5.0000 mg | ORAL_TABLET | Freq: Every day | ORAL | 0 refills | Status: DC

## 2018-12-09 MED ORDER — IRBESARTAN 75 MG PO TABS: 75.0000 mg | ORAL_TABLET | ORAL | 0 refills | Status: DC

## 2018-12-09 NOTE — Progress Notes (Signed)
Virtual Visit via Telephone Note  I connected with Carlos Valentine on 12/09/18 at  4:20 PM EDT by telephone and verified that I am speaking with the correct person using two identifiers.   I discussed the limitations, risks, security and privacy concerns of performing an evaluation and management service by telephone and the availability of in person appointments. I also discussed with the patient that there may be a patient responsible charge related to this service. The patient expressed understanding and agreed to proceed.   History of Present Illness:  HTN: diagnosed 4 months ago. At last OV, Amlodipine was decreased from 10 mg to 5 mg due to peripheral edema. Compliant with medications.  Reports for the last week he has been taking a full 10 mg of Amlodipine and the swelling has returned but not as badly as before.   Denies vision change, headache, chest pain with exertion, orthopnea, lightheadedness, syncope and edema. Risk factors include: HLD, tobacco use, male sex, age>55, obesity  Home BP readings 3/22 139/92, 154/91 3/23 150/90, 158/94 3/24 132/91,  3/25 132/88, 137/92 3/26 137/92,  3/27 138/94, 144/89 3/31 142/92   Urinary frequency Has eliminated fruit juice and soda at dinner time and is only drinking unsweetened green tea  He is having 2 episodes of nocturia nightly, reports this has improved compared to a month ago (3-4 times)   Past Medical History:  Diagnosis Date  . Hyperlipidemia   . Hypertension   . Obesity   . Tobacco use    No past surgical history on file. Social History   Tobacco Use  . Smoking status: Current Every Day Smoker    Packs/day: 1.50    Years: 45.00    Pack years: 67.50    Types: Cigarettes  . Smokeless tobacco: Never Used  . Tobacco comment: as of 07/15/18 0.5 ppd  Substance Use Topics  . Alcohol use: Never    Alcohol/week: 0.0 standard drinks    Frequency: Never   family history is not on file.    ROS: negative except as  noted in the HPI  Medications: Current Outpatient Medications  Medication Sig Dispense Refill  . amLODipine (NORVASC) 10 MG tablet Take 0.5 tablets (5 mg total) by mouth daily. 90 tablet 1  . aspirin 81 MG tablet Take 81 mg by mouth daily.    Marland Kitchen atorvastatin (LIPITOR) 20 MG tablet Take 1 tablet (20 mg total) by mouth daily. 90 tablet 1   No current facility-administered medications for this visit.    No Known Allergies     Objective:  BP (!) 125/91   Pulse 93  Pulm: Normal work of breathing, normal phonation, speaking in full sentences  Lab Results  Component Value Date   CREATININE 0.91 11/11/2018   BUN 16 11/11/2018   NA 138 11/11/2018   K 4.3 11/11/2018   CL 104 11/11/2018   CO2 25 11/11/2018   Lab Results  Component Value Date   HGBA1C 6.2 11/11/2018     No results found for this or any previous visit (from the past 72 hour(s)). No results found.    Assessment and Plan: 63 y.o. male with   .Diagnoses and all orders for this visit:  Hypertension goal BP (blood pressure) < 130/80 -     irbesartan (AVAPRO) 75 MG tablet; Take 1 tablet (75 mg total) by mouth every morning. -     BASIC METABOLIC PANEL WITH GFR -     amLODipine (NORVASC) 5 MG tablet; Take 1 tablet (  5 mg total) by mouth at bedtime.  Medication monitoring encounter -     BASIC METABOLIC PANEL WITH GFR   BP not at goal despite max dose Amlodipine Adding ARB, recheck renal function in 2 weeks Reduce Amlodipine to 5 mg due to peripheral edema. Recommend taking in the evening for nocturnal spikes in home BP's and taking Irbesartan in the mornings  Follow Up Instructions:    I discussed the assessment and treatment plan with the patient. The patient was provided an opportunity to ask questions and all were answered. The patient agreed with the plan and demonstrated an understanding of the instructions.   The patient was advised to call back or seek an in-person evaluation if the symptoms worsen or  if the condition fails to improve as anticipated.  I provided 11-20 minutes of non-face-to-face time during this encounter.   Carlis Stable, [place]

## 2019-01-12 ENCOUNTER — Other Ambulatory Visit: Payer: Self-pay | Admitting: Physician Assistant

## 2019-01-12 DIAGNOSIS — E782 Mixed hyperlipidemia: Secondary | ICD-10-CM

## 2019-01-13 ENCOUNTER — Other Ambulatory Visit: Payer: Self-pay | Admitting: Physician Assistant

## 2019-01-13 DIAGNOSIS — E782 Mixed hyperlipidemia: Secondary | ICD-10-CM

## 2019-03-14 ENCOUNTER — Other Ambulatory Visit: Payer: Self-pay | Admitting: Physician Assistant

## 2019-03-14 DIAGNOSIS — I10 Essential (primary) hypertension: Secondary | ICD-10-CM

## 2019-04-22 ENCOUNTER — Other Ambulatory Visit: Payer: Self-pay | Admitting: Physician Assistant

## 2019-04-22 DIAGNOSIS — E782 Mixed hyperlipidemia: Secondary | ICD-10-CM

## 2019-04-22 DIAGNOSIS — I10 Essential (primary) hypertension: Secondary | ICD-10-CM

## 2019-04-25 ENCOUNTER — Ambulatory Visit: Payer: 59 | Admitting: Physician Assistant

## 2019-05-22 ENCOUNTER — Other Ambulatory Visit: Payer: Self-pay | Admitting: Physician Assistant

## 2019-05-22 DIAGNOSIS — I10 Essential (primary) hypertension: Secondary | ICD-10-CM

## 2019-07-21 ENCOUNTER — Other Ambulatory Visit: Payer: Self-pay | Admitting: Physician Assistant

## 2019-07-21 DIAGNOSIS — E782 Mixed hyperlipidemia: Secondary | ICD-10-CM

## 2019-07-21 DIAGNOSIS — I10 Essential (primary) hypertension: Secondary | ICD-10-CM

## 2019-07-22 ENCOUNTER — Telehealth: Payer: Self-pay | Admitting: Physician Assistant

## 2019-07-22 DIAGNOSIS — E782 Mixed hyperlipidemia: Secondary | ICD-10-CM

## 2019-07-22 DIAGNOSIS — I10 Essential (primary) hypertension: Secondary | ICD-10-CM

## 2019-07-22 NOTE — Telephone Encounter (Signed)
Patient was a [name] patient and is now establishing care with you on 08/18/19 and needed a refill on all medication up until the appointment date. Please Advise.

## 2019-07-23 MED ORDER — ATORVASTATIN CALCIUM 20 MG PO TABS: 20.0000 mg | ORAL_TABLET | Freq: Every day | ORAL | 0 refills | Status: DC

## 2019-07-23 MED ORDER — AMLODIPINE BESYLATE 5 MG PO TABS: 5.0000 mg | ORAL_TABLET | Freq: Every day | ORAL | 0 refills | Status: DC

## 2019-07-23 MED ORDER — IRBESARTAN 75 MG PO TABS: 75.0000 mg | ORAL_TABLET | Freq: Every morning | ORAL | 0 refills | Status: DC

## 2019-07-23 NOTE — Telephone Encounter (Signed)
Handled.  For future reference, Elmo Putt, refill requests need to go to the medical assistant not to the providers.

## 2019-08-18 ENCOUNTER — Other Ambulatory Visit: Payer: Self-pay

## 2019-08-18 ENCOUNTER — Encounter: Payer: Self-pay | Admitting: Osteopathic Medicine

## 2019-08-18 ENCOUNTER — Ambulatory Visit (INDEPENDENT_AMBULATORY_CARE_PROVIDER_SITE_OTHER): Payer: 59 | Admitting: Osteopathic Medicine

## 2019-08-18 VITALS — BP 127/87 | HR 84 | Temp 97.5°F | Wt 195.1 lb

## 2019-08-18 DIAGNOSIS — R002 Palpitations: Secondary | ICD-10-CM | POA: Diagnosis not present

## 2019-08-18 DIAGNOSIS — G8929 Other chronic pain: Secondary | ICD-10-CM

## 2019-08-18 DIAGNOSIS — R9431 Abnormal electrocardiogram [ECG] [EKG]: Secondary | ICD-10-CM | POA: Diagnosis not present

## 2019-08-18 DIAGNOSIS — R7303 Prediabetes: Secondary | ICD-10-CM | POA: Diagnosis not present

## 2019-08-18 DIAGNOSIS — M25511 Pain in right shoulder: Secondary | ICD-10-CM | POA: Diagnosis not present

## 2019-08-18 LAB — POCT GLYCOSYLATED HEMOGLOBIN (HGB A1C): Hemoglobin A1C: 6 % — AB (ref 4.0–5.6)

## 2019-08-18 NOTE — Patient Instructions (Addendum)
Muchas cosas pueden causar palpitaciones y [place]. Creo que esto podra deberse a que la presin arterial est bajando o puede ser un latido cardaco anormal. Me gustara hacerme algunas pruebas para averiguar por qu tiene estas palpitaciones: anlisis de sangre, un monitor cardaco, posiblemente una ecografa del corazn. Si tiene Teacher, early years/pre, dificultad para respirar, desmayo, vaya a la hospital! Tambin te enviaremos a un cardilogo, ya que tu ekg no fue del todo normal.

## 2019-08-18 NOTE — Progress Notes (Signed)
HPI: Carlos Valentine is a 63 y.o. male who  has a past medical history of Hyperlipidemia, Hypertension, Obesity, and Tobacco use.  he presents to [facility] today, 08/18/19,  for chief complaint of:  Prediabetes follow-up Shoulder pain  Palpitations     Patient here today for A1c check/prediabetes.  A1c is as below, no concerns. Patient has some concerns for chronic right shoulder pain.  Previously saw Dr. Denyse Amass for this, injection helped briefly but now pain seems to be back and worse. Reports occasional episodes of heart beating really quickly and feeling dizzy like he is going to pass out.  Lasts for a few seconds then resolve spontaneously.  No loss of consciousness or falls, no chest pain/shortness of breath.   At today's visit 08/18/19 ... PMH, PSH, FH reviewed and updated as needed.  Current medication list and allergy/intolerance hx reviewed and updated as needed. (See remainder of HPI, ROS, Phys Exam below)   No results found.  Results for orders placed or performed in visit on 08/18/19 (from the past 72 hour(s))  POCT HgB A1C     Status: Abnormal   Collection Time: 08/18/19  3:56 PM  Result Value Ref Range   Hemoglobin A1C 6.0 (A) 4.0 - 5.6 %   HbA1c POC (<> result, manual entry)     HbA1c, POC (prediabetic range)     HbA1c, POC (controlled diabetic range)       EKG interpretation: Rate: 82 Rhythm: sinus Concerning T wave flattening/inversion  Previous EKG none      ASSESSMENT/PLAN: The primary encounter diagnosis was Prediabetes. Diagnoses of Palpitations, Chronic right shoulder pain, and Abnormal EKG were also pertinent to this visit.   Orders Placed This Encounter  Procedures  . MR Shoulder Right Wo Contrast  . DG Shoulder Right  . CBC  . COMPLETE METABOLIC PANEL WITH GFR  . LIPID SCREENING  . TSH  . MAG  . Ambulatory referral to Cardiology  . POCT HgB A1C  . EKG 12-Lead  . Rhythm ECG, report     No  orders of the defined types were placed in this encounter.   Patient Instructions  Muchas cosas pueden causar palpitaciones y [place]. Creo que esto podra deberse a que la presin arterial est bajando o puede ser un latido cardaco anormal. Me gustara hacerme algunas pruebas para averiguar por qu tiene estas palpitaciones: anlisis de sangre, un monitor cardaco, posiblemente una ecografa del corazn. Si tiene Chief of Staff, dificultad para respirar, desmayo, vaya a la hospital! Tambin te enviaremos a un cardilogo, ya que tu ekg no fue del todo normal.   (Many things can cause heart palpitations and dizziness. i think this might be blood pressure dropping low, or it might be an abnormal heartbeat. I would like to get some tests to figure out why you have these palpitations: blood work, a heart monitor, possibly an ultrasound of the heart. If you have bad chest pain, trouble breathing, passing out, please go to the hospital! We will also send you to a cardiologist, since your ekg was not totally normal.)    Follow-up plan: Return for RECHECK PENDING LAB AND ECHOCARDIOGRAM RESULTS / IF WORSE OR CHANGE.                                                 ################################################# ################################################# ################################################# #################################################  Current Meds  Medication Sig  . amLODipine (NORVASC) 5 MG tablet Take 1 tablet (5 mg total) by mouth at bedtime. APPOINTMENT REQUIRED FOR REFILLS  . aspirin 81 MG tablet Take 81 mg by mouth daily.  Marland Kitchen atorvastatin (LIPITOR) 20 MG tablet Take 1 tablet (20 mg total) by mouth daily. APPOINTMENT REQUIRED FOR REFILLS  . irbesartan (AVAPRO) 75 MG tablet Take 1 tablet (75 mg total) by mouth every morning.    No Known Allergies     Review of Systems:  Constitutional: No recent  illness  HEENT: No  headache, no vision change  Cardiac: No  chest pain, No  pressure, +palpitations  Respiratory:  No  shortness of breath. No  Cough  Gastrointestinal: No  abdominal pain, no change on bowel habits  Musculoskeletal: No new myalgia/arthralgia  Neurologic: No  weakness, +Dizziness  Psychiatric: No  concerns with depression, No  concerns with anxiety  Exam:  BP 127/87 (BP Location: Left Arm, Patient Position: Sitting, Cuff Size: Normal)   Pulse 84   Temp (!) 97.5 F (36.4 C) (Oral)   Wt 195 lb 1.9 oz (88.5 kg)   BMI 32.47 kg/m   Constitutional: VS see above. General Appearance: alert, well-developed, well-nourished, NAD  Eyes: Normal lids and conjunctive, non-icteric sclera  Ears, Nose, Mouth, Throat: MMM, Normal external inspection ears/nares/mouth/lips/gums.  Neck: No masses, trachea midline.   Respiratory: Normal respiratory effort. no wheeze, no rhonchi, no rales  Cardiovascular: S1/S2 normal, no murmur, no rub/gallop auscultated. RRR.   Musculoskeletal: Gait normal. Symmetric and independent movement of all extremities  Neurological: Normal balance/coordination. No tremor.  Skin: warm, dry, intact.   Psychiatric: Normal judgment/insight. Normal mood and affect. Oriented x3.       Visit summary with medication list and pertinent instructions was printed for patient to review, patient was advised to alert [place] if any updates are needed. All questions at time of visit were answered - patient instructed to contact office with any additional concerns. ER/RTC precautions were reviewed with the patient and understanding verbalized.   Note: Total time spent 40 minutes, greater than 50% of the visit was spent face-to-face counseling and coordinating care for the following: The primary encounter diagnosis was Prediabetes. Diagnoses of Palpitations, Chronic right shoulder pain, and Abnormal EKG were also pertinent to this visit.Marland Kitchen  Please note: voice  recognition software was used to produce this document, and typos may escape review. Please contact Dr. Sheppard Coil for any needed clarifications.    Follow up plan: Return for RECHECK PENDING LAB AND ECHOCARDIOGRAM RESULTS / IF WORSE OR CHANGE.

## 2019-08-21 ENCOUNTER — Telehealth: Payer: Self-pay

## 2019-08-21 NOTE — Telephone Encounter (Signed)
[facility], SENT REFERRAL TO [place]

## 2019-09-30 ENCOUNTER — Telehealth: Payer: Self-pay

## 2019-09-30 NOTE — Telephone Encounter (Signed)
Auth denied due to 1. Patient not getting his x-rays done after multiple calls advising him to do so. 2. No physical exam specifically for shoulder showing need for imaging. 3. Needs 6 week trial within last 3 months of provider directed treatment.   FYI to PCP, all of these must be done in order to get this approved. Best option if patient still wants this done is to have X-rays done and f/u with Dr T.   We have tried to contact patient multiple times with no response. No upcoming appointments.

## 2019-11-18 ENCOUNTER — Other Ambulatory Visit: Payer: Self-pay | Admitting: Osteopathic Medicine

## 2019-11-18 DIAGNOSIS — I10 Essential (primary) hypertension: Secondary | ICD-10-CM

## 2019-11-18 NOTE — Telephone Encounter (Signed)
Requested medication (s) are due for refill today:yes  Requested medication (s) are on the active medication list: yes  Last refill:  10/17/2019  Future visit scheduled: yes  Notes to clinic:  review for refill   Requested Prescriptions  Pending Prescriptions Disp Refills   irbesartan (AVAPRO) 75 MG tablet [Pharmacy Med Name: Irbesartan 75 MG Oral Tablet] 90 tablet 0    Sig: TAKE 1 TABLET BY MOUTH IN THE MORNING      Cardiovascular:  Angiotensin Receptor Blockers Failed - 11/18/2019  3:10 PM      Failed - Cr in normal range and within 180 days    Creat  Date Value Ref Range Status  11/11/2018 0.91 0.70 - 1.25 mg/dL Final    Comment:    For patients >17 years of age, the reference limit for Creatinine is approximately 13% higher for people identified as African-American. .           Failed - K in normal range and within 180 days    Potassium  Date Value Ref Range Status  11/11/2018 4.3 3.5 - 5.3 mmol/L Final          Passed - Patient is not pregnant      Passed - Last BP in normal range    BP Readings from Last 1 Encounters:  08/18/19 127/87          Passed - Valid encounter within last 6 months    Recent Outpatient Visits           3 months ago Prediabetes   [facility], [place], DO   11 months ago Hypertension goal BP (blood pressure) < 130/80   [place] [facility], [place]ner[place] Pho, PA-C   1 year ago Prediabetes   [facility], [place], [place]   1 year ago Hypertension goal BP (blood pressure) < 130/80   [facility], [place], PA-C   1 year ago Hypertension goal BP (blood pressure) < 130/80   [facility], [place], [place]

## 2019-11-20 ENCOUNTER — Emergency Department (INDEPENDENT_AMBULATORY_CARE_PROVIDER_SITE_OTHER)
Admission: EM | Admit: 2019-11-20 | Discharge: 2019-11-20 | Disposition: A | Discharge status: Home / Self Care | Payer: 59 | Source: Home / Self Care | Attending: Family Medicine | Admitting: Family Medicine

## 2019-11-20 ENCOUNTER — Emergency Department (HOSPITAL_BASED_OUTPATIENT_CLINIC_OR_DEPARTMENT_OTHER): Payer: 59

## 2019-11-20 ENCOUNTER — Inpatient Hospital Stay (HOSPITAL_BASED_OUTPATIENT_CLINIC_OR_DEPARTMENT_OTHER)
Admission: EM | Admit: 2019-11-20 | Discharge: 2019-11-25 | DRG: 247 | Disposition: A | Discharge status: Home / Self Care | Payer: 59 | Source: Ambulatory Visit | Attending: Cardiology | Admitting: Cardiology

## 2019-11-20 ENCOUNTER — Encounter (HOSPITAL_BASED_OUTPATIENT_CLINIC_OR_DEPARTMENT_OTHER): Payer: Self-pay | Admitting: Emergency Medicine

## 2019-11-20 ENCOUNTER — Other Ambulatory Visit: Payer: Self-pay

## 2019-11-20 DIAGNOSIS — I248 Other forms of acute ischemic heart disease: Secondary | ICD-10-CM | POA: Diagnosis present

## 2019-11-20 DIAGNOSIS — I471 Supraventricular tachycardia: Secondary | ICD-10-CM | POA: Diagnosis not present

## 2019-11-20 DIAGNOSIS — Z8249 Family history of ischemic heart disease and other diseases of the circulatory system: Secondary | ICD-10-CM | POA: Diagnosis not present

## 2019-11-20 DIAGNOSIS — R55 Syncope and collapse: Secondary | ICD-10-CM

## 2019-11-20 DIAGNOSIS — Z87891 Personal history of nicotine dependence: Secondary | ICD-10-CM | POA: Diagnosis not present

## 2019-11-20 DIAGNOSIS — R7303 Prediabetes: Secondary | ICD-10-CM | POA: Diagnosis present

## 2019-11-20 DIAGNOSIS — R002 Palpitations: Secondary | ICD-10-CM

## 2019-11-20 DIAGNOSIS — I2510 Atherosclerotic heart disease of native coronary artery without angina pectoris: Secondary | ICD-10-CM | POA: Diagnosis not present

## 2019-11-20 DIAGNOSIS — R079 Chest pain, unspecified: Secondary | ICD-10-CM | POA: Diagnosis not present

## 2019-11-20 DIAGNOSIS — F17210 Nicotine dependence, cigarettes, uncomplicated: Secondary | ICD-10-CM | POA: Diagnosis present

## 2019-11-20 DIAGNOSIS — E785 Hyperlipidemia, unspecified: Secondary | ICD-10-CM | POA: Diagnosis not present

## 2019-11-20 DIAGNOSIS — E119 Type 2 diabetes mellitus without complications: Secondary | ICD-10-CM | POA: Diagnosis not present

## 2019-11-20 DIAGNOSIS — Z955 Presence of coronary angioplasty implant and graft: Secondary | ICD-10-CM

## 2019-11-20 DIAGNOSIS — Z20822 Contact with and (suspected) exposure to covid-19: Secondary | ICD-10-CM | POA: Diagnosis not present

## 2019-11-20 DIAGNOSIS — Z79899 Other long term (current) drug therapy: Secondary | ICD-10-CM

## 2019-11-20 DIAGNOSIS — H539 Unspecified visual disturbance: Secondary | ICD-10-CM

## 2019-11-20 DIAGNOSIS — Z7982 Long term (current) use of aspirin: Secondary | ICD-10-CM

## 2019-11-20 DIAGNOSIS — Z720 Tobacco use: Secondary | ICD-10-CM

## 2019-11-20 DIAGNOSIS — I10 Essential (primary) hypertension: Secondary | ICD-10-CM | POA: Diagnosis present

## 2019-11-20 LAB — URINALYSIS, ROUTINE W REFLEX MICROSCOPIC
Bilirubin Urine: NEGATIVE
Glucose, UA: NEGATIVE mg/dL
Hgb urine dipstick: NEGATIVE
Ketones, ur: NEGATIVE mg/dL
Leukocytes,Ua: NEGATIVE
Nitrite: NEGATIVE
Protein, ur: NEGATIVE mg/dL
Specific Gravity, Urine: <1.005 — ABNORMAL LOW (ref 1.005–1.030)
pH: 6 (ref 5.0–8.0)

## 2019-11-20 LAB — BASIC METABOLIC PANEL
Anion gap: 9 (ref 5–15)
BUN: 14 mg/dL (ref 8–23)
CO2: 24 mmol/L (ref 22–32)
Calcium: 9.7 mg/dL (ref 8.9–10.3)
Chloride: 102 mmol/L (ref 98–111)
Creatinine, Ser: 0.8 mg/dL (ref 0.61–1.24)
GFR calc Af Amer: >60 mL/min (ref >60)
GFR calc non Af Amer: >60 mL/min (ref >60)
Glucose, Bld: 109 mg/dL — ABNORMAL HIGH (ref 70–99)
Potassium: 4 mmol/L (ref 3.5–5.1)
Sodium: 135 mmol/L (ref 135–145)

## 2019-11-20 LAB — CBC
HCT: 45.7 % (ref 39.0–52.0)
Hemoglobin: 15.5 g/dL (ref 13.0–17.0)
MCH: 30.6 pg (ref 26.0–34.0)
MCHC: 33.9 g/dL (ref 30.0–36.0)
MCV: 90.3 fL (ref 80.0–100.0)
Platelets: 417 10*3/uL — ABNORMAL HIGH (ref 150–400)
RBC: 5.06 MIL/uL (ref 4.22–5.81)
RDW: 13.3 % (ref 11.5–15.5)
WBC: 12.1 10*3/uL — ABNORMAL HIGH (ref 4.0–10.5)
nRBC: 0 % (ref 0.0–0.2)

## 2019-11-20 LAB — TROPONIN I (HIGH SENSITIVITY)
Troponin I (High Sensitivity): 5 ng/L (ref <18)
Troponin I (High Sensitivity): 6 ng/L (ref <18)

## 2019-11-20 LAB — RESPIRATORY PANEL BY RT PCR (FLU A&B, COVID)
Influenza A by PCR: NEGATIVE
Influenza B by PCR: NEGATIVE
SARS Coronavirus 2 by RT PCR: NEGATIVE

## 2019-11-20 LAB — MAGNESIUM: Magnesium: 1.9 mg/dL (ref 1.7–2.4)

## 2019-11-20 MED ORDER — METOPROLOL TARTRATE 25 MG PO TABS
25.0000 mg | ORAL_TABLET | Freq: Two times a day (BID) | ORAL | Status: DC
  Administered 2019-11-20 – 2019-11-21 (×2): 25 mg via ORAL
  Filled 2019-11-20 (×2): qty 1

## 2019-11-20 MED ORDER — ASPIRIN 81 MG PO CHEW
81.0000 mg | CHEWABLE_TABLET | Freq: Every day | ORAL | Status: DC
  Administered 2019-11-21 – 2019-11-25 (×4): 81 mg via ORAL
  Filled 2019-11-20 (×7): qty 1

## 2019-11-20 MED ORDER — ACETAMINOPHEN 325 MG PO TABS: 650.0000 mg | ORAL_TABLET | ORAL | Status: DC | PRN

## 2019-11-20 MED ORDER — METOPROLOL TARTRATE 5 MG/5ML IV SOLN
5.0000 mg | Freq: Once | INTRAVENOUS | Status: AC
  Administered 2019-11-20: 5 mg via INTRAVENOUS
  Filled 2019-11-20: qty 5

## 2019-11-20 MED ORDER — ATORVASTATIN CALCIUM 10 MG PO TABS
20.0000 mg | ORAL_TABLET | Freq: Every day | ORAL | Status: DC
  Administered 2019-11-21: 20 mg via ORAL
  Filled 2019-11-20: qty 2

## 2019-11-20 MED ORDER — ONDANSETRON HCL 4 MG/2ML IJ SOLN
4.0000 mg | Freq: Four times a day (QID) | INTRAMUSCULAR | Status: DC | PRN
  Administered 2019-11-24: 4 mg via INTRAVENOUS
  Filled 2019-11-20: qty 2

## 2019-11-20 MED ORDER — NITROGLYCERIN 0.4 MG SL SUBL: 0.4000 mg | SUBLINGUAL_TABLET | SUBLINGUAL | Status: DC | PRN

## 2019-11-20 MED ORDER — IRBESARTAN 75 MG PO TABS
75.0000 mg | ORAL_TABLET | Freq: Every morning | ORAL | Status: DC
  Administered 2019-11-21 – 2019-11-25 (×5): 75 mg via ORAL
  Filled 2019-11-20 (×5): qty 1

## 2019-11-20 MED ORDER — AMLODIPINE BESYLATE 5 MG PO TABS
5.0000 mg | ORAL_TABLET | Freq: Every day | ORAL | Status: DC
  Administered 2019-11-20 – 2019-11-24 (×5): 5 mg via ORAL
  Filled 2019-11-20 (×5): qty 1

## 2019-11-20 NOTE — ED Provider Notes (Signed)
[facility] EMERGENCY DEPARTMENT Provider Note   CSN: [id number] Arrival date & time: 11/20/19  1213     History Chief Complaint  Patient presents with  . Loss of Consciousness    Marquese Burkland is a 64 y.o. male.  He has a history of hypertension and hyperlipidemia.  He was at work today having his meal sitting at a table when he acutely felt his heart racing, followed by feeling lightheaded/dizzy and then had a syncopal event.  No reported seizure activity.  EMS evaluated the patient but he declined transport.  He went to urgent care and then presented here.  He says he still feels a little bit lightheaded and his vision is a little blurry.  No chest pain.  No shortness of breath.  No prior history of syncope.  No recent illness or medication changes.  The history is provided by the patient.  Loss of Consciousness Episode history:  Single Most recent episode:  Today Progression:  Resolved Chronicity:  New Context: normal activity   Witnessed: yes   Relieved by:  Lying down Worsened by:  Nothing Ineffective treatments:  None tried Associated symptoms: dizziness and palpitations   Associated symptoms: no anxiety, no chest pain, no confusion, no difficulty breathing, no fever, no focal weakness, no headaches, no nausea, no seizures, no shortness of breath and no vomiting        Past Medical History:  Diagnosis Date  . Hyperlipidemia   . Hypertension   . Obesity   . Tobacco use     Patient Active Problem List   Diagnosis Date Noted  . Peripheral edema 11/21/2018  . Prediabetes 11/11/2018  . Glucosuria 10/20/2018  . Nocturia 10/18/2018  . Encounter for screening for malignant neoplasm of respiratory organs 10/18/2018  . Smokers' cough (HCC) 07/21/2018  . Mixed hyperlipidemia 07/21/2018  . Hypertension goal BP (blood pressure) < 130/80 07/21/2018  . Encounter for tobacco use cessation counseling 09/22/2015  . Tobacco use disorder 09/22/2015    History  reviewed. No pertinent surgical history.     Family History  Problem Relation Age of Onset  . Hypertension Mother     Social History   Tobacco Use  . Smoking status: Current Every Day Smoker    Packs/day: 1.50    Years: 45.00    Pack years: 67.50    Types: Cigarettes  . Smokeless tobacco: Never Used  . Tobacco comment: as of 07/15/18 0.5 ppd  Substance Use Topics  . Alcohol use: Never    Alcohol/week: 0.0 standard drinks  . Drug use: Not Currently    Home Medications Prior to Admission medications   Medication Sig Start Date End Date Taking? Authorizing Provider  amLODipine (NORVASC) 5 MG tablet Take 1 tablet (5 mg total) by mouth at bedtime. APPOINTMENT REQUIRED FOR REFILLS 07/23/19   Sunnie Nielsen, DO  aspirin 81 MG tablet Take 81 mg by mouth daily.    [provider]  atorvastatin (LIPITOR) 20 MG tablet Take 1 tablet (20 mg total) by mouth daily. APPOINTMENT REQUIRED FOR REFILLS 07/23/19   Sunnie Nielsen, DO  irbesartan (AVAPRO) 75 MG tablet TAKE 1 TABLET BY MOUTH IN THE MORNING 11/18/19   Sunnie Nielsen, DO    Allergies    Patient has no known allergies.  Review of Systems   Review of Systems  Constitutional: Negative for fever.  HENT: Negative for sore throat.   Eyes: Positive for visual disturbance. Negative for pain.  Respiratory: Negative for shortness of breath.  Cardiovascular: Positive for palpitations and syncope. Negative for chest pain.  Gastrointestinal: Negative for abdominal pain, nausea and vomiting.  Genitourinary: Negative for dysuria.  Musculoskeletal: Negative for neck pain.  Skin: Negative for rash.  Neurological: Positive for dizziness. Negative for focal weakness, seizures and headaches.  Psychiatric/Behavioral: Negative for confusion.    Physical Exam Updated Vital Signs BP (!) 140/93 (BP Location: Right Arm)   Pulse 96   Temp 98.1 F (36.7 C) (Oral)   Resp (!) 24   Ht 5\' 6"  (1.676 m)   Wt 90.7 kg   SpO2 99%    BMI 32.28 kg/m   Physical Exam Vitals and nursing note reviewed.  Constitutional:      Appearance: He is well-developed.  HENT:     Head: Normocephalic and atraumatic.  Eyes:     Conjunctiva/sclera: Conjunctivae normal.  Cardiovascular:     Rate and Rhythm: Normal rate and regular rhythm.     Heart sounds: No murmur.  Pulmonary:     Effort: Pulmonary effort is normal. No respiratory distress.     Breath sounds: Normal breath sounds.  Abdominal:     Palpations: Abdomen is soft.     Tenderness: There is no abdominal tenderness.  Musculoskeletal:        General: No deformity or signs of injury. Normal range of motion.     Cervical back: Neck supple.  Skin:    General: Skin is warm and dry.     Capillary Refill: Capillary refill takes less than 2 seconds.  Neurological:     General: No focal deficit present.     Mental Status: He is alert and oriented to person, place, and time.     Cranial Nerves: No cranial nerve deficit.     Sensory: No sensory deficit.     Motor: No weakness.     Gait: Gait normal.     ED Results / Procedures / Treatments   Labs (all labs ordered are listed, but only abnormal results are displayed) Labs Reviewed  BASIC METABOLIC PANEL - Abnormal; Notable for the following components:      Result Value   Glucose, Bld 109 (*)    All other components within normal limits  CBC - Abnormal; Notable for the following components:   WBC 12.1 (*)    Platelets 417 (*)    All other components within normal limits  URINALYSIS, ROUTINE W REFLEX MICROSCOPIC - Abnormal; Notable for the following components:   Color, Urine STRAW (*)    Specific Gravity, Urine <1.005 (*)    All other components within normal limits  RESPIRATORY PANEL BY RT PCR (FLU A&B, COVID)  MAGNESIUM  TROPONIN I (HIGH SENSITIVITY)  TROPONIN I (HIGH SENSITIVITY)    EKG EKG Interpretation  Date/Time:  Thursday November 20 2019 12:22:57 EDT Ventricular Rate:  93 PR Interval:    QRS  Duration: 106 QT Interval:  363 QTC Calculation: 452 R Axis:   65 Text Interpretation: Sinus rhythm Ventricular premature complex Borderline T abnormalities, anterior leads No old tracing to compare Confirmed by Aletta Edouard [phone]) on 11/20/2019 12:25:09 PM   Radiology DG Chest Port 1 View  Result Date: 11/20/2019 CLINICAL DATA:  Syncope and tachycardic today. EXAM: PORTABLE CHEST 1 VIEW COMPARISON:  None. FINDINGS: Patient slightly rotated to the left. Lungs are adequately inflated without focal airspace consolidation or effusion. Cardiomediastinal silhouette and remainder of the exam is unremarkable. IMPRESSION: No active disease. Electronically Signed   By: Marin Olp M.D.  On: 11/20/2019 13:35    Procedures .Critical Care Performed by: Terrilee Files, MD Authorized by: Terrilee Files, MD   Critical care provider statement:    Critical care time (minutes):  45   Critical care time was exclusive of:  Separately billable procedures and treating other patients   Critical care was necessary to treat or prevent imminent or life-threatening deterioration of the following conditions:  Circulatory failure   Critical care was time spent personally by me on the following activities:  Discussions with consultants, evaluation of patient's response to treatment, examination of patient, ordering and performing treatments and interventions, ordering and review of laboratory studies, ordering and review of radiographic studies, pulse oximetry, re-evaluation of patient's condition, obtaining history from patient or surrogate, review of old charts and development of treatment plan with patient or surrogate   I assumed direction of critical care for this patient from another provider in my specialty: no     (including critical care time)  Medications Ordered in ED Medications  metoprolol tartrate (LOPRESSOR) injection 5 mg (5 mg Intravenous Given 11/20/19 1332)    ED Course  I have  reviewed the triage vital signs and the nursing notes.  Pertinent labs & imaging results that were available during my care of the patient were reviewed by me and considered in my medical decision making (see chart for details).  Clinical Course as of Nov 20 1402  Thu Nov 20, 2019  1237 Differential includes arrhythmia, ACS, PE, vasovagal, hypovolemia   [MB]  1244 Called to patient bedside as he became tachycardic.  Find the patient awake and alert in narrow complex tachycardia of almost 200.  EKG showing likely SVT.  Before were able to intervene patient broke on his own.  We will continue to monitor and putting on pacer pads.   [MB]  1306 Discussed with Dr. Jens Som from [facility] cardiology.  He is recommending beta-blocker and is recommending admission for syncope in the setting of SVT.   [MB]    Clinical Course User Index [MB] Terrilee Files, MD   MDM Rules/Calculators/A&P                       Final Clinical Impression(s) / ED Diagnoses Final diagnoses:  Syncope and collapse  SVT (supraventricular tachycardia) [facility])    Rx / DC Orders ED Discharge Orders    None       Terrilee Files, MD 11/20/19 [phone]

## 2019-11-20 NOTE — ED Notes (Signed)
Pt reported feeling dizzy, observed HR on monitor increased to 192.  Reports mild chest pain left side of chest.  12 lead ekg obtained.  Dr Charm Barges at bedside.  Within 30 seconds recovered back to baseline.  No further chest pain.  Defib pads placed per Dr Charm Barges request.

## 2019-11-20 NOTE — ED Triage Notes (Signed)
States he was at work, became dizzy and felt like his heart was racing and then woke up on the floor. States he did have chest pain earlier as well. He was evaluated by EMS but was not transported.

## 2019-11-20 NOTE — ED Provider Notes (Signed)
[facility]    CSN: [id number] Arrival date & time: 11/20/19  1021      History   Chief Complaint Chief Complaint  Patient presents with  . Fall  . Dizziness    HPI Carlos Valentine is a 64 y.o. male.   Patient reports that about an hour ago while at work, he suddenly felt his heart beating very rapidly without chest pain, followed by dizziness and witnessed loss of consciousness.  He apparently was unconscious for several minutes, awakening spontaneously.  EMS was summoned, recommending that the patient be transported to a hospital ED but he refused.  His wife transported him here.  Patient reports that he no longer feels a rapid heart rate, but a sensation of dizziness persists and his vision is decreased.  He denies headache, nausea/vomiting, chest pain, and shortness of breath.  The history is provided by the patient.    Past Medical History:  Diagnosis Date  . Hyperlipidemia   . Hypertension   . Obesity   . Tobacco use     Patient Active Problem List   Diagnosis Date Noted  . Peripheral edema 11/21/2018  . Prediabetes 11/11/2018  . Glucosuria 10/20/2018  . Nocturia 10/18/2018  . Encounter for screening for malignant neoplasm of respiratory organs 10/18/2018  . Smokers' cough (HCC) 07/21/2018  . Mixed hyperlipidemia 07/21/2018  . Hypertension goal BP (blood pressure) < 130/80 07/21/2018  . Encounter for tobacco use cessation counseling 09/22/2015  . Tobacco use disorder 09/22/2015    History reviewed. No pertinent surgical history.     Home Medications    Prior to Admission medications   Medication Sig Start Date End Date Taking? Authorizing Provider  amLODipine (NORVASC) 5 MG tablet Take 1 tablet (5 mg total) by mouth at bedtime. APPOINTMENT REQUIRED FOR REFILLS 07/23/19  Yes Sunnie Nielsen, DO  irbesartan (AVAPRO) 75 MG tablet TAKE 1 TABLET BY MOUTH IN THE MORNING 11/18/19  Yes Sunnie Nielsen, DO  aspirin 81 MG tablet Take 81 mg by  mouth daily.    [provider]  atorvastatin (LIPITOR) 20 MG tablet Take 1 tablet (20 mg total) by mouth daily. APPOINTMENT REQUIRED FOR REFILLS 07/23/19   Sunnie Nielsen, DO    Family History Family History  Problem Relation Age of Onset  . Hypertension Mother     Social History Social History   Tobacco Use  . Smoking status: Current Every Day Smoker    Packs/day: 1.50    Years: 45.00    Pack years: 67.50    Types: Cigarettes  . Smokeless tobacco: Never Used  . Tobacco comment: as of 07/15/18 0.5 ppd  Substance Use Topics  . Alcohol use: Never    Alcohol/week: 0.0 standard drinks  . Drug use: Not Currently     Allergies   Patient has no known allergies.   Review of Systems Review of Systems  Constitutional: Positive for activity change. Negative for chills, diaphoresis, fatigue and fever.  HENT: Negative for hearing loss.   Eyes: Positive for visual disturbance.  Respiratory: Negative for shortness of breath.   Cardiovascular: Positive for palpitations. Negative for chest pain and leg swelling.  Gastrointestinal: Negative.   Genitourinary: Negative.   Musculoskeletal: Negative.   Skin: Negative.   Neurological: Positive for dizziness and syncope. Negative for tremors, seizures, facial asymmetry, speech difficulty, weakness, light-headedness, numbness and headaches.     Physical Exam Triage Vital Signs ED Triage Vitals  Enc Vitals Group     BP 11/20/19 1040 (!)  157/103     Pulse Rate 11/20/19 1040 (!) 101     Resp 11/20/19 1040 16     Temp 11/20/19 1040 98.6 F (37 C)     Temp Source 11/20/19 1040 Oral     SpO2 11/20/19 1040 97 %     Weight --      Height --      Head Circumference --      Peak Flow --      Pain Score 11/20/19 1037 0     Pain Loc --      Pain Edu? --      Excl. in [place]? --    No data found.  Updated Vital Signs BP (!) 157/103 (BP Location: Right Arm)   Pulse (!) 101   Temp 98.6 F (37 C) (Oral)   Resp 16   SpO2  97%   Visual Acuity Right Eye Distance:   Left Eye Distance:   Bilateral Distance:    Right Eye Near:   Left Eye Near:    Bilateral Near:     Physical Exam Vitals and nursing note reviewed.  Constitutional:      General: He is not in acute distress. HENT:     Head: Atraumatic.     Right Ear: External ear normal.     Left Ear: External ear normal.     Nose: Nose normal.     Mouth/Throat:     Mouth: Mucous membranes are moist.     Pharynx: Oropharynx is clear.  Eyes:     Extraocular Movements: Extraocular movements intact.     Conjunctiva/sclera: Conjunctivae normal.     Pupils: Pupils are equal, round, and reactive to light.  Cardiovascular:     Rate and Rhythm: Tachycardia present.     Heart sounds: Normal heart sounds.  Pulmonary:     Breath sounds: Normal breath sounds.  Abdominal:     Palpations: Abdomen is soft.  Musculoskeletal:     Cervical back: Tenderness present.     Right lower leg: No edema.     Left lower leg: No edema.  Skin:    General: Skin is warm and dry.  Neurological:     General: No focal deficit present.     Mental Status: He is alert and oriented to person, place, and time.     Cranial Nerves: No cranial nerve deficit.     Sensory: No sensory deficit.     Motor: No weakness.     Coordination: Coordination normal.     Gait: Gait normal.     Deep Tendon Reflexes: Reflexes normal.      UC Treatments / Results  Labs (all labs ordered are listed, but only abnormal results are displayed) Labs Reviewed - No data to display  EKG   Radiology No results found.  Procedures Procedures (including critical care time)  Medications Ordered in UC Medications - No data to display  Initial Impression / Assessment and Plan / UC Course  I have reviewed the triage vital signs and the nursing notes.  Pertinent labs & imaging results that were available during my care of the patient were reviewed by me and considered in my medical decision making  (see chart for details).    Patient's vital signs are stable and he is safe to travel by private vehicle (wife to drive) to [facility] ED for further evaluation.   Final Clinical Impressions(s) / UC Diagnoses   Final diagnoses:  Syncope and collapse  Vision changes  Palpitations   Discharge Instructions   None    ED Prescriptions    None        Lattie Haw, MD 11/20/19 1226

## 2019-11-20 NOTE — ED Notes (Signed)
Patient is being discharged from the Urgent Care Center and sent to the Emergency Department via POV driven by wife. Per Dr. Cathren Harsh, patient is stable but in need of higher level of care due to LOC lasting 5 minutes or more and continued tachycardia and dizziness. Patient is aware and verbalizes understanding of plan of care.  Vitals:   11/20/19 1040  BP: (!) 157/103  Pulse: (!) 101  Resp: 16  Temp: 98.6 F (37 C)  SpO2: 97%

## 2019-11-20 NOTE — ED Triage Notes (Signed)
Patient presents to Urgent Care with complaints of becoming dizzy, feeling tachycardic, and passing out since about an hour ago. Patient reports he thinks he was unconscious for about 5 minutes. Pt states he has been taking his htn medications as prescribed. EMS was called to the scene, EKG obtained, and the patient was recommended to go to the hospital but he refused. The patient came here with his wife today, states he still feels dizzy. BP 157/103 upon arrival.  MD bedside for evaluation at this time.

## 2019-11-20 NOTE — ED Notes (Signed)
Report provided to [facility] for transport ETA 1615

## 2019-11-20 NOTE — ED Provider Notes (Signed)
  Physical Exam  BP (!) 155/83   Pulse 100   Temp 98.1 F (36.7 C) (Oral)   Resp (!) 21   Ht 5\' 6"  (1.676 m)   Wt 90.7 kg   SpO2 95% Comment: rm air  BMI 32.28 kg/m   Physical Exam  ED Course/Procedures   Clinical Course as of Nov 19 1629  Thu Nov 20, 2019  1237 Differential includes arrhythmia, ACS, PE, vasovagal, hypovolemia   [MB]  1244 Called to patient bedside as he became tachycardic.  Find the patient awake and alert in narrow complex tachycardia of almost 200.  EKG showing likely SVT.  Before were able to intervene patient broke on his own.  We will continue to monitor and putting on pacer pads.   [MB]  1306 Discussed with Dr. 1238 from [facility] cardiology.  He is recommending beta-blocker and is recommending admission for syncope in the setting of SVT.   [MB]    Clinical Course User Index [MB] [facility], MD    Procedures  MDM  Patient developed some anterior chest pain while getting loaded for transfer.  Remains in sinus rhythm however.  EKG done by [facility] and was stable to prior.  Will transfer      Terrilee Files, MD 11/20/19 [phone]

## 2019-11-20 NOTE — ED Notes (Signed)
Pt resting quietly, no further episodes of racing heartbeat.  Denies chest pain.  Wife notified of inpatient bed assignment.  Awaiting ETA for transport.

## 2019-11-20 NOTE — H&P (Signed)
Cardiology Admission History and Physical:   Patient ID: Carlos Valentine MRN: [id number]; DOB: August 30, 1956   Admission date: 11/20/2019  Primary Care Provider: Sunnie Nielsen, DO Primary Cardiologist: No primary care provider on file.  Primary Electrophysiologist:  None   Chief Complaint:  Syncope/SVT  Patient Profile:   Carlos Valentine is a 64 y.o. male with PMH of HTN, HL, and tobacco use who presented [facility] with syncope and SVT.   History of Present Illness:   Mr. Tift is a 64 yo male with PMH noted above. He follows with a PCP but has never seen a cardiologist in the past. Denies any family hx of cardiac disease. He currently works as a Location manager. Was in his usual state of health while at work this morning. Went to eat lunch and felt his heart start racing while in the break room. Got dizzy and then woke up on the floor. EMS was called with recommendations to go to the ED but he refused. His wife brought him to the UC in [place]. After evaluation it was recommended that he be taken to the ED for further work up.  In the ED his labs showed stable electrolytes, Cr 0.80, hsTn 5>>6, WBC 12.1, Hgb 15.5. CXR negative. EKG in the ED showed SR with PVCs and borderline TW abnormalities in the lateral leads. While in the ED he developed recurrent tachycardia, narrow complex rate 200 felt to be SVT. EKG obtained and confirmed. He was placed on Zoll pads but converted back to SR on his own. Cardiology was consulted and recommended admission given SVT in conjunction with SVT.   Past Medical History:  Diagnosis Date  . Hyperlipidemia   . Hypertension   . Obesity   . Tobacco use     History reviewed. No pertinent surgical history.   Medications Prior to Admission: Prior to Admission medications   Medication Sig Start Date End Date Taking? Authorizing Provider  amLODipine (NORVASC) 5 MG tablet Take 1 tablet (5 mg total) by mouth at bedtime. APPOINTMENT REQUIRED FOR  REFILLS 07/23/19   Sunnie Nielsen, DO  aspirin 81 MG tablet Take 81 mg by mouth daily.    [provider]  atorvastatin (LIPITOR) 20 MG tablet Take 1 tablet (20 mg total) by mouth daily. APPOINTMENT REQUIRED FOR REFILLS 07/23/19   Sunnie Nielsen, DO  irbesartan (AVAPRO) 75 MG tablet TAKE 1 TABLET BY MOUTH IN THE MORNING 11/18/19   Sunnie Nielsen, DO     Allergies:   No Known Allergies  Social History:   Social History   Socioeconomic History  . Marital status: Legally Separated    Spouse name: Not on file  . Number of children: Not on file  . Years of education: Not on file  . Highest education level: Not on file  Occupational History  . Not on file  Tobacco Use  . Smoking status: Current Every Day Smoker    Packs/day: 1.50    Years: 45.00    Pack years: 67.50    Types: Cigarettes  . Smokeless tobacco: Never Used  . Tobacco comment: as of 07/15/18 0.5 ppd  Substance and Sexual Activity  . Alcohol use: Never    Alcohol/week: 0.0 standard drinks  . Drug use: Not Currently  . Sexual activity: Yes  Other Topics Concern  . Not on file  Social History Narrative  . Not on file   Social Determinants of Health   Financial Resource Strain:   . Difficulty of Paying Living Expenses:  Food Insecurity:   . Worried About Programme researcher, broadcasting/film/video in the Last Year:   . Barista in the Last Year:   Transportation Needs:   . Freight forwarder (Medical):   Marland Kitchen Lack of Transportation (Non-Medical):   Physical Activity:   . Days of Exercise per Week:   . Minutes of Exercise per Session:   Stress:   . Feeling of Stress :   Social Connections:   . Frequency of Communication with Friends and Family:   . Frequency of Social Gatherings with Friends and Family:   . Attends Religious Services:   . Active Member of Clubs or Organizations:   . Attends Banker Meetings:   Marland Kitchen Marital Status:   Intimate Partner Violence:   . Fear of Current or  Ex-Partner:   . Emotionally Abused:   Marland Kitchen Physically Abused:   . Sexually Abused:     Family History:   The patient's family history includes Hypertension in his mother.    ROS:  Please see the history of present illness.  All other ROS reviewed and negative.     Physical Exam/Data:   Vitals:   11/20/19 1530 11/20/19 1553 11/20/19 1600 11/20/19 1615  BP: 131/86 (!) 146/85 (!) 151/91 (!) 155/83  Pulse: 86 87 90 100  Resp: (!) 24 (!) 28 (!) 26 (!) 21  Temp:      TempSrc:      SpO2: 94% 95% 95% 95%  Weight:      Height:       No intake or output data in the 24 hours ending 11/20/19 1623 Last 3 Weights 11/20/2019 08/18/2019 11/11/2018  Weight (lbs) 200 lb 195 lb 1.9 oz 206 lb  Weight (kg) 90.719 kg 88.506 kg 93.441 kg     Body mass index is 32.28 kg/m.  General:  Well nourished, well developed, in no acute distress HEENT: normal Lymph: no adenopathy Neck: no JVD Endocrine:  No thryomegaly Vascular: No carotid bruits; FA pulses 2+ bilaterally without bruits  Cardiac:  normal S1, S2; RRR; no murmur  Lungs:  clear to auscultation bilaterally, no wheezing, rhonchi or rales  Abd: soft, nontender, no hepatomegaly  Ext: no edema Musculoskeletal:  No deformities, BUE and BLE strength normal and equal Skin: warm and dry  Neuro:  CNs 2-12 intact, no focal abnormalities noted Psych:  Normal affect    EKG:  The ECG that was done 11/20/19 was personally reviewed and demonstrates SR with PVCs and borderline TW abnormalities in lateral leads-->SVT rate 199--> ST  Relevant CV Studies:  N/a   Laboratory Data:  High Sensitivity Troponin:   Recent Labs  Lab 11/20/19 1230 11/20/19 1439  TROPONINIHS 5 6      Chemistry Recent Labs  Lab 11/20/19 1230  NA 135  K 4.0  CL 102  CO2 24  GLUCOSE 109*  BUN 14  CREATININE 0.80  CALCIUM 9.7  GFRNONAA >60  GFRAA >60  ANIONGAP 9    No results for input(s): PROT, ALBUMIN, AST, ALT, ALKPHOS, BILITOT in the last 168 hours. Hematology  Recent Labs  Lab 11/20/19 1230  WBC 12.1*  RBC 5.06  HGB 15.5  HCT 45.7  MCV 90.3  MCH 30.6  MCHC 33.9  RDW 13.3  PLT 417*   BNPNo results for input(s): BNP, PROBNP in the last 168 hours.  DDimer No results for input(s): DDIMER in the last 168 hours.   Radiology/Studies:  [facility] Chest Port 1 View  Result  Date: 11/20/2019 CLINICAL DATA:  Syncope and tachycardic today. EXAM: PORTABLE CHEST 1 VIEW COMPARISON:  None. FINDINGS: Patient slightly rotated to the left. Lungs are adequately inflated without focal airspace consolidation or effusion. Cardiomediastinal silhouette and remainder of the exam is unremarkable. IMPRESSION: No active disease. Electronically Signed   By: Marin Olp M.D.   On: 11/20/2019 13:35    Assessment and Plan:   Segundo Makela is a 64 y.o. male with PMH of HTN, HL, and tobacco use who presented [facility] with syncope and SVT.   1. Syncope/SVT: has been in his usual state of health until today. Had a sudden onset of palpitations and feeling his heart race, then had syncope. Denies any chest pain prior to this episode. In the ED his labs were stable. EKG showed SR, but developed recurrent SVT which was self terminating. Mild chest pressure after that second episode. Blood pressures remained stable. No further episodes. -- add metoprolol 25mg  BID -- check echo -- does have TW changes in lateral leads, will plan for coronary CTA tomorrow for risk modification   2. HTN: PTA medications include Norvasc 5mg  and Avapro 75mg . Borderline controlled.   3. HL: on Lipitor 20mg , LDL 169 11/19 -- check lipid in am  4. Prediabetic: Hgb A1c 6 back in 12/20.   Severity of Illness: The appropriate patient status for this patient is OBSERVATION. Observation status is judged to be reasonable and necessary in order to provide the required intensity of service to ensure the patient's safety. The patient's presenting symptoms, physical exam findings, and initial radiographic and  laboratory data in the context of their medical condition is felt to place them at decreased risk for further clinical deterioration. Furthermore, it is anticipated that the patient will be medically stable for discharge from the hospital within 2 midnights of admission. The following factors support the patient status of observation.   " The patient's presenting symptoms include syncope, dizziness. " The physical exam findings include stable exam. " The initial radiographic and laboratory data are EKG with SVT.  For questions or updates, please contact [facility] Please consult [email or web address] for contact info under     Signed, Reino Bellis, NP  11/20/2019 4:23 PM

## 2019-11-21 ENCOUNTER — Inpatient Hospital Stay (HOSPITAL_COMMUNITY): Payer: 59

## 2019-11-21 ENCOUNTER — Observation Stay (HOSPITAL_COMMUNITY): Payer: 59

## 2019-11-21 DIAGNOSIS — R079 Chest pain, unspecified: Secondary | ICD-10-CM

## 2019-11-21 DIAGNOSIS — I471 Supraventricular tachycardia: Secondary | ICD-10-CM | POA: Diagnosis present

## 2019-11-21 DIAGNOSIS — I2510 Atherosclerotic heart disease of native coronary artery without angina pectoris: Secondary | ICD-10-CM

## 2019-11-21 DIAGNOSIS — Z20822 Contact with and (suspected) exposure to covid-19: Secondary | ICD-10-CM | POA: Diagnosis present

## 2019-11-21 DIAGNOSIS — Z87891 Personal history of nicotine dependence: Secondary | ICD-10-CM | POA: Diagnosis not present

## 2019-11-21 DIAGNOSIS — F17210 Nicotine dependence, cigarettes, uncomplicated: Secondary | ICD-10-CM | POA: Diagnosis present

## 2019-11-21 DIAGNOSIS — Z79899 Other long term (current) drug therapy: Secondary | ICD-10-CM | POA: Diagnosis not present

## 2019-11-21 DIAGNOSIS — R9431 Abnormal electrocardiogram [ECG] [EKG]: Secondary | ICD-10-CM

## 2019-11-21 DIAGNOSIS — Z7982 Long term (current) use of aspirin: Secondary | ICD-10-CM | POA: Diagnosis not present

## 2019-11-21 DIAGNOSIS — Z8249 Family history of ischemic heart disease and other diseases of the circulatory system: Secondary | ICD-10-CM | POA: Diagnosis not present

## 2019-11-21 DIAGNOSIS — I10 Essential (primary) hypertension: Secondary | ICD-10-CM | POA: Diagnosis present

## 2019-11-21 DIAGNOSIS — E785 Hyperlipidemia, unspecified: Secondary | ICD-10-CM | POA: Diagnosis present

## 2019-11-21 DIAGNOSIS — I248 Other forms of acute ischemic heart disease: Secondary | ICD-10-CM | POA: Diagnosis present

## 2019-11-21 DIAGNOSIS — R55 Syncope and collapse: Secondary | ICD-10-CM | POA: Diagnosis present

## 2019-11-21 DIAGNOSIS — E119 Type 2 diabetes mellitus without complications: Secondary | ICD-10-CM | POA: Diagnosis present

## 2019-11-21 LAB — LIPID PANEL
Cholesterol: 118 mg/dL (ref 0–200)
HDL: 39 mg/dL — ABNORMAL LOW (ref >40)
LDL Cholesterol: 70 mg/dL (ref 0–99)
Total CHOL/HDL Ratio: 3 RATIO
Triglycerides: 43 mg/dL (ref <150)
VLDL: 9 mg/dL (ref 0–40)

## 2019-11-21 LAB — BASIC METABOLIC PANEL
Anion gap: 12 (ref 5–15)
BUN: 16 mg/dL (ref 8–23)
CO2: 21 mmol/L — ABNORMAL LOW (ref 22–32)
Calcium: 9.1 mg/dL (ref 8.9–10.3)
Chloride: 106 mmol/L (ref 98–111)
Creatinine, Ser: 0.91 mg/dL (ref 0.61–1.24)
GFR calc Af Amer: >60 mL/min (ref >60)
GFR calc non Af Amer: >60 mL/min (ref >60)
Glucose, Bld: 104 mg/dL — ABNORMAL HIGH (ref 70–99)
Potassium: 4.2 mmol/L (ref 3.5–5.1)
Sodium: 139 mmol/L (ref 135–145)

## 2019-11-21 LAB — ECHOCARDIOGRAM COMPLETE
Height: 66 in
Weight: 3051.2 oz

## 2019-11-21 LAB — TSH: TSH: 0.823 u[IU]/mL (ref 0.350–4.500)

## 2019-11-21 LAB — HIV ANTIBODY (ROUTINE TESTING W REFLEX): HIV Screen 4th Generation wRfx: NONREACTIVE

## 2019-11-21 LAB — HEMOGLOBIN A1C
Hgb A1c MFr Bld: 6.4 % — ABNORMAL HIGH (ref 4.8–5.6)
Mean Plasma Glucose: 136.98 mg/dL

## 2019-11-21 MED ORDER — METOPROLOL SUCCINATE ER 50 MG PO TB24
50.0000 mg | ORAL_TABLET | Freq: Every day | ORAL | Status: DC
  Administered 2019-11-22 – 2019-11-25 (×4): 50 mg via ORAL
  Filled 2019-11-21 (×4): qty 1

## 2019-11-21 MED ORDER — CALCIUM CARBONATE ANTACID 500 MG PO CHEW
1.0000 | CHEWABLE_TABLET | Freq: Once | ORAL | Status: AC
  Administered 2019-11-21: 200 mg via ORAL
  Filled 2019-11-21: qty 1

## 2019-11-21 MED ORDER — NITROGLYCERIN 0.4 MG SL SUBL
SUBLINGUAL_TABLET | SUBLINGUAL | Status: AC
  Filled 2019-11-21: qty 2

## 2019-11-21 MED ORDER — PANTOPRAZOLE SODIUM 40 MG PO TBEC
40.0000 mg | DELAYED_RELEASE_TABLET | Freq: Every day | ORAL | Status: DC
  Administered 2019-11-22 – 2019-11-25 (×4): 40 mg via ORAL
  Filled 2019-11-21 (×4): qty 1

## 2019-11-21 MED ORDER — METOPROLOL TARTRATE 100 MG PO TABS
100.0000 mg | ORAL_TABLET | Freq: Once | ORAL | Status: AC
  Administered 2019-11-21: 100 mg via ORAL
  Filled 2019-11-21: qty 1

## 2019-11-21 MED ORDER — METOPROLOL SUCCINATE ER 50 MG PO TB24: 50.0000 mg | ORAL_TABLET | Freq: Every day | ORAL | Status: DC

## 2019-11-21 MED ORDER — ATORVASTATIN CALCIUM 80 MG PO TABS
80.0000 mg | ORAL_TABLET | Freq: Every day | ORAL | Status: DC
  Administered 2019-11-22 – 2019-11-25 (×4): 80 mg via ORAL
  Filled 2019-11-21 (×4): qty 1

## 2019-11-21 MED ORDER — IOHEXOL 350 MG/ML SOLN
80.0000 mL | Freq: Once | INTRAVENOUS | Status: AC | PRN
  Administered 2019-11-21: 80 mL via INTRAVENOUS

## 2019-11-21 NOTE — Plan of Care (Signed)
  Problem: Education: Goal: Knowledge of General Education information will improve Description: Including pain rating scale, medication(s)/side effects and non-pharmacologic comfort measures Outcome: Progressing   Problem: Clinical Measurements: Goal: Will remain free from infection Outcome: Progressing   Problem: Activity: Goal: Risk for activity intolerance will decrease Outcome: Progressing   Problem: Nutrition: Goal: Adequate nutrition will be maintained Outcome: Progressing   Problem: Coping: Goal: Level of anxiety will decrease Outcome: Progressing   Problem: Pain Managment: Goal: General experience of comfort will improve Outcome: Progressing   Problem: Safety: Goal: Ability to remain free from injury will improve Outcome: Progressing   

## 2019-11-21 NOTE — Progress Notes (Signed)
  Echocardiogram 2D Echocardiogram has been performed.  Tye Savoy 11/21/2019, 9:22 AM

## 2019-11-21 NOTE — Progress Notes (Signed)
Progress Note  Patient Name: Carlos Valentine Date of Encounter: 11/21/2019  Primary Cardiologist: Fransico Him, MD -New  Subjective   Patient Is feeling very well this morning.  He has had no chest pain since his episode of SVT yesterday.  He denies any difficulty breathing or orthopnea.  He has had no lightheadedness or presyncope.  His rhythm has been maintaining sinus rhythm through the night.  Inpatient Medications    Scheduled Meds: . amLODipine  5 mg Oral QHS  . aspirin  81 mg Oral Daily  . atorvastatin  20 mg Oral Daily  . irbesartan  75 mg Oral q morning - 10a  . metoprolol tartrate  25 mg Oral BID   Continuous Infusions:  PRN Meds: acetaminophen, nitroGLYCERIN, ondansetron (ZOFRAN) IV   Vital Signs    Vitals:   11/20/19 1804 11/20/19 2026 11/21/19 0429 11/21/19 0905  BP: (!) 146/94 138/83 (!) 140/95 140/86  Pulse: 85 86 78 73  Resp:  20 20   Temp: 98.1 F (36.7 C) (!) 97.3 F (36.3 C) 98.3 F (36.8 C)   TempSrc: Oral Oral Oral   SpO2: 94% 95% 95%   Weight:   86.5 kg   Height:        Intake/Output Summary (Last 24 hours) at 11/21/2019 0923 Last data filed at 11/20/2019 2045 Gross per 24 hour  Intake 362 ml  Output --  Net 362 ml   Last 3 Weights 11/21/2019 11/20/2019 08/18/2019  Weight (lbs) 190 lb 11.2 oz 200 lb 195 lb 1.9 oz  Weight (kg) 86.501 kg 90.719 kg 88.506 kg      Telemetry    Sinus rhythm in the 70s- Personally Reviewed  ECG    No further tracings for review  Physical Exam   GEN: No acute distress.   Neck: No JVD Cardiac: RRR, no murmurs, rubs, or gallops.  Respiratory: Clear to auscultation bilaterally. GI: Soft, nontender, non-distended  MS: No edema; No deformity. Neuro:  Nonfocal  Psych: Normal affect   Labs    High Sensitivity Troponin:   Recent Labs  Lab 11/20/19 1230 11/20/19 1439  TROPONINIHS 5 6      Chemistry Recent Labs  Lab 11/20/19 1230 11/21/19 0153  NA 135 139  K 4.0 4.2  CL 102 106  CO2 24  21*  GLUCOSE 109* 104*  BUN 14 16  CREATININE 0.80 0.91  CALCIUM 9.7 9.1  GFRNONAA >60 >60  GFRAA >60 >60  ANIONGAP 9 12     Hematology Recent Labs  Lab 11/20/19 1230  WBC 12.1*  RBC 5.06  HGB 15.5  HCT 45.7  MCV 90.3  MCH 30.6  MCHC 33.9  RDW 13.3  PLT 417*    BNPNo results for input(s): BNP, PROBNP in the last 168 hours.   DDimer No results for input(s): DDIMER in the last 168 hours.   Radiology    DG Chest Port 1 View  Result Date: 11/20/2019 CLINICAL DATA:  Syncope and tachycardic today. EXAM: PORTABLE CHEST 1 VIEW COMPARISON:  None. FINDINGS: Patient slightly rotated to the left. Lungs are adequately inflated without focal airspace consolidation or effusion. Cardiomediastinal silhouette and remainder of the exam is unremarkable. IMPRESSION: No active disease. Electronically Signed   By: Marin Olp M.D.   On: 11/20/2019 13:35    Cardiac Studies   Echocardiogram pending  CCTA pending  Patient Profile     64 y.o. male with a hx of HTN, HLD and ongoing tobacco use.  He has  never had any CP, SOB, dizziness, palpitations or syncope in the past who is being seen for syncope, chest pain and palpitations.  He was noted to have narrow complex SVT at 200 bpm, converted spontaneously to normal sinus rhythm.  Assessment & Plan    Syncope -Likely occurred due to decreased cerebral perfusion in setting of SVT at 200 bpm. -Awaiting echo results -No further syncope overnight and no further episodes of SVT on telemetry.  SVT -Likely cause of his syncope -Started on Toprol-XL 25 mg daily -TSH was normal at 0.823 -Electrolytes in normal range -Maintaining sinus rhythm, no further SVT on telemetry  Chest pain -No further chest pain since his episode of SVT. -Chest pain in the setting of SVT, possibly related to demand ischemia. -High-sensitivity troponin negative x2 -EKG with T wave inversions in V4-V6 -CVD risk factors include ongoing tobacco use, hyperlipidemia  and prediabetes untreated -Given his risk factors as well as syncope and chest pain in the setting of SVT, we are planning for cardiac CTA to be done today.  Renal function is normal.  Hypertension -Blood pressure was somewhat elevated yesterday.  He is continued on irbesartan 75 mg and amlodipine 5 mg from home with addition of beta-blocker. -Blood pressure is still mildly elevated 140/86.  We will continue to monitor after a.m. medications and make adjustments as needed.  Hyperlipidemia -On atorvastatin 20 mg daily.  Lipid panel done today showed LDL of 70.  Continue current therapy.  Diabetes type 2 -A1c 6.4% -Would consider adding Metformin 500 mg twice daily.  Will need follow-up with primary care.  For questions or updates, please contact [facility] Please consult [email or web address] for contact info under        Signed, Berton Bon, NP  11/21/2019, 9:23 AM

## 2019-11-22 DIAGNOSIS — R002 Palpitations: Secondary | ICD-10-CM

## 2019-11-22 NOTE — Progress Notes (Signed)
Progress Note  Patient Name: Carlos Valentine Date of Encounter: 11/22/2019  Primary Cardiologist: Armanda Magic, MD   Subjective   CP free, awaiting plan after CCTA and CT FFR (pending)  Inpatient Medications    Scheduled Meds: . amLODipine  5 mg Oral QHS  . aspirin  81 mg Oral Daily  . atorvastatin  80 mg Oral Daily  . irbesartan  75 mg Oral q morning - 10a  . metoprolol succinate  50 mg Oral Daily  . pantoprazole  40 mg Oral Daily   Continuous Infusions:  PRN Meds: acetaminophen, nitroGLYCERIN, ondansetron (ZOFRAN) IV   Vital Signs    Vitals:   11/21/19 2104 11/22/19 0100 11/22/19 0551 11/22/19 0939  BP: 130/84  (!) 135/93 (!) 129/111  Pulse: 65  84 82  Resp: 18  18   Temp: 97.9 F (36.6 C)  (!) 97.5 F (36.4 C)   TempSrc: Oral  Oral   SpO2: 93% 92% 93%   Weight:   86.3 kg   Height:        Intake/Output Summary (Last 24 hours) at 11/22/2019 1044 Last data filed at 11/21/2019 1104 Gross per 24 hour  Intake 240 ml  Output --  Net 240 ml   Last 3 Weights 11/22/2019 11/21/2019 11/20/2019  Weight (lbs) 190 lb 3.2 oz 190 lb 11.2 oz 200 lb  Weight (kg) 86.274 kg 86.501 kg 90.719 kg      Telemetry     NSR, no recurrence of SVT - Personally Reviewed  ECG    Sinus tachycardia, borderline T wave abnl in lateral leads - Personally Reviewed  Physical Exam   GEN: No acute distress.   Neck: No JVD Cardiac: RRR, no murmurs, rubs, or gallops.  Respiratory: Clear to auscultation bilaterally. GI: Soft, nontender, non-distended  MS: No edema; No deformity. Neuro:  Nonfocal  Psych: Normal affect   Labs    High Sensitivity Troponin:   Recent Labs  Lab 11/20/19 1230 11/20/19 1439  TROPONINIHS 5 6      Chemistry Recent Labs  Lab 11/20/19 1230 11/21/19 0153  NA 135 139  K 4.0 4.2  CL 102 106  CO2 24 21*  GLUCOSE 109* 104*  BUN 14 16  CREATININE 0.80 0.91  CALCIUM 9.7 9.1  GFRNONAA >60 >60  GFRAA >60 >60  ANIONGAP 9 12     Hematology Recent  Labs  Lab 11/20/19 1230  WBC 12.1*  RBC 5.06  HGB 15.5  HCT 45.7  MCV 90.3  MCH 30.6  MCHC 33.9  RDW 13.3  PLT 417*    BNPNo results for input(s): BNP, PROBNP in the last 168 hours.   DDimer No results for input(s): DDIMER in the last 168 hours.   Radiology    CT CORONARY MORPH W/CTA COR W/SCORE W/CA W/CM &/OR WO/CM  Addendum Date: 11/21/2019   ADDENDUM REPORT: 11/21/2019 13:00 EXAM: Cardiac/Coronary  CT TECHNIQUE: The patient was scanned on a [employer]. FINDINGS: A 120 kV prospective scan was triggered in the descending thoracic aorta at 111 HU's. Axial non-contrast 3 mm slices were carried out through the heart. The data set was analyzed on a dedicated work station and scored using the Agatson method. Gantry rotation speed was 250 msecs and collimation was .6 mm. No beta blockade and 0.8 mg of sl NTG was given. The 3D data set was reconstructed in 5% intervals of the 67-82 % of the R-R cycle. Diastolic phases were analyzed on a dedicated work station using  MPR, MIP and VRT modes. The patient received 80 cc of contrast. Aorta:  Normal size.  No calcifications.  No dissection. Aortic Valve:  Trileaflet.  No calcifications. Coronary Arteries:  Normal coronary origin.  Right dominance. RCA is a large dominant artery that gives rise to PDA and PLVB. There is moderate non calcified plaque in the ostial RCA with associated stenosis of 50-59% with mildly aneurysmal segment distal to this. There is mild calcified plaque in the proximal RCA with associated stenosis of 25-49%. There is minimal calcified plaque in the mid and distal RCA with associated stenosis of 0-24%. Left main is very short artery that gives rise to LAD and LCX arteries. There is no plaque. LAD is a large vessel. There is moderate mixed plaque with high risk features (HU<30, spotty calcification, napkin ring sign) in the proximal LAD with associated stenosis 50%. There is a mild calcified plaque in the mid LAD with  associated stenosis of 25-49%. LCX is a non-dominant artery that gives rise to one large OM1 branch. There is mild calcified plaque in the proximal and mid OM1 with associated stenosis of 25-49%. The proximal and mid LCx appears normal but the distal LCx definition is poor. Other findings: Normal pulmonary vein drainage into the left atrium. Normal let atrial appendage without a thrombus. Normal size of the pulmonary artery. IMPRESSION: 1. Coronary calcium score of 442. This was 86th percentile for age and sex matched control. 2.  Normal coronary origin with right dominance. 3. Moderate atherosclerosis of the LAD and RCA. CAD-RADS 3. The proximal LAD lesion has high risk features. 4. Consider symptom guided anti-ischemic and preventive pharmacotherapy as well as risk factor modification per guideline-directed care. 5.  This study has been submitted for FFR flow analysis. Armanda Magic Electronically Signed   By: Armanda Magic   On: 11/21/2019 13:00   Result Date: 11/21/2019 EXAM: OVER-READ INTERPRETATION  CT CHEST The following report is an over-read performed by radiologist Dr. Donzetta Kohut of [facility] on 11/21/2019. This over-read does not include interpretation of cardiac or coronary anatomy or pathology. The coronary calcium score/coronary CTA interpretation by the cardiologist is attached. COMPARISON:  None FINDINGS: Vascular: See dedicated report regarding cardiovascular findings. Ascending thoracic aorta at 3.3 cm. Descending thoracic aorta is normal caliber. No pericardial fluid. Mixing of contrast in the pulmonary vascular bed. Mediastinum/Nodes: Limited visualization of the mediastinum without signs of adenopathy. Visualized portions of the esophagus are normal. Lungs/Pleura: Small nodule along the minor fissure in the right chest 5 mm. (Image 5, series 12) 4 mm right middle lobe nodule (image 15, series 12) airways are patent. No consolidation or pleural effusion. Upper Abdomen:  Incidental imaging of upper abdominal contents without acute abnormality. There is mild distal esophageal thickening with small hiatal hernia. Musculoskeletal: No acute bone finding IMPRESSION: 1. No acute findings demonstrated. See dedicated report regarding cardiovascular findings. 2. 5 mm and 3 mm pulmonary nodule in the right chest as described. No follow-up needed if patient is low-risk (and has no known or suspected primary neoplasm). Non-contrast chest CT can be considered in 12 months if patient is high-risk. This recommendation follows the consensus statement: Guidelines for Management of Incidental Pulmonary Nodules Detected on CT Images: From the Fleischner Society 2017; Radiology 2017; 284:228-243. Electronically Signed: By: Donzetta Kohut M.D. On: 11/21/2019 11:59   DG Chest Port 1 View  Result Date: 11/20/2019 CLINICAL DATA:  Syncope and tachycardic today. EXAM: PORTABLE CHEST 1 VIEW COMPARISON:  None. FINDINGS: Patient  slightly rotated to the left. Lungs are adequately inflated without focal airspace consolidation or effusion. Cardiomediastinal silhouette and remainder of the exam is unremarkable. IMPRESSION: No active disease. Electronically Signed   By: Elberta Fortis M.D.   On: 11/20/2019 13:35   ECHOCARDIOGRAM COMPLETE  Result Date: 11/21/2019    ECHOCARDIOGRAM REPORT   Patient Name:   Carlos Valentine Date of Exam: 11/21/2019 Medical Rec #:  [id number]         Height:       66.0 in Accession #:    [id number]        Weight:       190.7 lb Date of Birth:  Sep 23, 1955        BSA:          1.960 m Patient Age:    63 years          BP:           140/95 mmHg Patient Gender: M                 HR:           78 bpm. Exam Location:  Inpatient Procedure: 2D Echo Indications:    Abnormal ECG R94.31  History:        Patient has no prior history of Echocardiogram examinations.                 Signs/Symptoms:Syncope; Risk Factors:Hypertension, Dyslipidemia                 and Current Smoker.  Sonographer:     Thurman Coyer RDCS (AE) Referring Phys: [phone] LINDSAY B ROBERTS IMPRESSIONS  1. Left ventricular ejection fraction, by estimation, is 55 to 60%. The left ventricle has normal function. The left ventricle grossly normal regional wall motion. Left ventricular diastolic parameters are consistent with Grade I diastolic dysfunction (impaired relaxation).  2. Right ventricular systolic function is normal. The right ventricular size is normal. Tricuspid regurgitation signal is inadequate for assessing PA pressure.  3. The mitral valve is normal in structure. No evidence of mitral valve regurgitation. No evidence of mitral stenosis.  4. The aortic valve was not well visualized. Aortic valve regurgitation is trivial. No aortic stenosis is present.  5. Aortic dilatation noted. There is mild dilatation of the aortic root (41 mm) and of the ascending aorta measuring 43 mm.  6. The inferior vena cava is normal in size with greater than 50% respiratory variability, suggesting right atrial pressure of 3 mmHg. FINDINGS  Left Ventricle: Left ventricular ejection fraction, by estimation, is 55 to 60%. The left ventricle has normal function. The left ventricle has grossly normal regional wall motion. The left ventricular internal cavity size was normal in size. There is no left ventricular hypertrophy. Left ventricular diastolic parameters are consistent with Grade I diastolic dysfunction (impaired relaxation). Right Ventricle: The right ventricular size is normal. No increase in right ventricular wall thickness. Right ventricular systolic function is normal. Tricuspid regurgitation signal is inadequate for assessing PA pressure. Left Atrium: Left atrial size was normal in size. Right Atrium: Right atrial size was normal in size. Pericardium: There is no evidence of pericardial effusion. Mitral Valve: The mitral valve is normal in structure. Normal mobility of the mitral valve leaflets. No evidence of mitral valve  regurgitation. No evidence of mitral valve stenosis. Tricuspid Valve: The tricuspid valve is normal in structure. Tricuspid valve regurgitation is not demonstrated. No evidence of tricuspid stenosis. Aortic Valve: The aortic valve was not  well visualized. Aortic valve regurgitation is trivial. No aortic stenosis is present. Pulmonic Valve: The pulmonic valve was not well visualized. Pulmonic valve regurgitation is not visualized. No evidence of pulmonic stenosis. Aorta: Aortic dilatation noted. There is mild dilatation of the aortic root and of the ascending aorta measuring 43 mm. Venous: The inferior vena cava is normal in size with greater than 50% respiratory variability, suggesting right atrial pressure of 3 mmHg. IAS/Shunts: No atrial level shunt detected by color flow Doppler.  LEFT VENTRICLE PLAX 2D LVIDd:         4.00 cm  Diastology LVIDs:         2.70 cm  LV e' lateral:   8.16 cm/s LV PW:         0.95 cm  LV E/e' lateral: 7.5 LV IVS:        1.15 cm  LV e' medial:    7.62 cm/s LVOT diam:     2.15 cm  LV E/e' medial:  8.0 LV SV:         68 LV SV Index:   35 LVOT Area:     3.63 cm  RIGHT VENTRICLE RV S prime:     10.90 cm/s TAPSE (M-mode): 1.6 cm LEFT ATRIUM             Index       RIGHT ATRIUM           Index LA diam:        3.20 cm 1.63 cm/m  RA Area:     13.20 cm LA Vol (A2C):   50.9 ml 25.97 ml/m RA Volume:   25.30 ml  12.91 ml/m LA Vol (A4C):   27.5 ml 14.03 ml/m LA Biplane Vol: 37.8 ml 19.29 ml/m  AORTIC VALVE LVOT Vmax:   107.00 cm/s LVOT Vmean:  61.600 cm/s LVOT VTI:    0.188 m  AORTA Ao Root diam: 4.10 cm Ao Asc diam:  4.30 cm MITRAL VALVE MV Area (PHT): 2.39 cm    SHUNTS MV Decel Time: 317 msec    Systemic VTI:  0.19 m MV E velocity: 61.30 cm/s  Systemic Diam: 2.15 cm MV A velocity: 65.10 cm/s MV E/A ratio:  0.94 Cherlynn Kaiser MD Electronically signed by Cherlynn Kaiser MD Signature Date/Time: 11/21/2019/12:47:35 PM    Final     Cardiac Studies  CCTA IMPRESSION: 1. Coronary calcium  score of 442. This was 86th percentile for age and sex matched control.  2.  Normal coronary origin with right dominance.  3. Moderate atherosclerosis of the LAD and RCA. CAD-RADS 3. The proximal LAD lesion has high risk features.  4. Consider symptom guided anti-ischemic and preventive pharmacotherapy as well as risk factor modification per guideline-directed care.  5.  This study has been submitted for FFR flow analysis.  CT FFR pending  Patient Profile     Assessment & Plan    Syncope -Likely occurred due to decreased cerebral perfusion in setting of SVT at 200 bpm. - no acute findings on echo -No further syncope overnight and no further episodes of SVT on telemetry.  SVT -Likely cause of his syncope -Started on Toprol-XL 25 mg daily -TSH was normal at 0.823 -Electrolytes in normal range -Maintaining sinus rhythm, no further SVT on telemetry  Chest pain -No further chest pain since his episode of SVT. -Chest pain in the setting of SVT, possibly related to demand ischemia. -High-sensitivity troponin negative x2 -EKG with T wave inversions in V4-V6 -CVD risk factors  include ongoing tobacco use, hyperlipidemia and prediabetes untreated -Given his risk factors as well as syncope and chest pain in the setting of SVT, CCTA performed. Moderate disease, awaiting results of FFR and re-measuring of aorta given mild dilation noted on echo.  -CT FFR pending, but I have independently reviewed the preliminary images. Abnormal FFR in the proximal LAD with FFR of around 0.7. Recommend cath on Monday. Patient will discuss with his wife and let us know what he decides. -not cleared for work with a concerning proximal LAD lesion.   Hypertension -Blood pressure was somewhat elevated yesterday.  He is continued on irbesartan 75 mg and amlodipine 5 mg from home with addition of beta-blocker. -Blood pressure is still mildly elevated 140/86.  We will continue to monitor after a.m.  medications and make adjustments as needed.  Hyperlipidemia -On atorvastatin 20 mg daily.  Lipid panel done today showed LDL of 70.  Continue current therapy.  Diabetes type 2 -A1c 6.4% -Would consider adding Metformin 500 mg twice daily.  Will need follow-up with primary care.  Possible Ascending aorta dilation - will review with CT reading physician to reevaluate measurements with CT, given that echo showed dilation. Cross sectional measurements to confirm.  For questions or updates, please contact [facility] Please consult [email or web address] for contact info under        Signed, Parke PoissonGayatri A Ember Gottwald, MD  11/22/2019, 10:44 AM

## 2019-11-23 MED ORDER — CALCIUM CARBONATE ANTACID 500 MG PO CHEW
1.0000 | CHEWABLE_TABLET | ORAL | Status: DC | PRN
  Administered 2019-11-23: 200 mg via ORAL
  Filled 2019-11-23: qty 1

## 2019-11-23 NOTE — Progress Notes (Signed)
Patient had run of SVT, he c/o feeling palpitations and dizzy while it was happening. Patient now resting in chair. MD notified.

## 2019-11-23 NOTE — Progress Notes (Addendum)
Progress Note  Patient Name: Carlos Valentine Date of Encounter: 11/23/2019  Primary Cardiologist: Armanda Magic, MD   Subjective   Cardiac CT FFR results with flow limiting lesion in the prox LAD. Plan for cath tomorrow. No further chest pain.   Inpatient Medications    Scheduled Meds: . amLODipine  5 mg Oral QHS  . aspirin  81 mg Oral Daily  . atorvastatin  80 mg Oral Daily  . irbesartan  75 mg Oral q morning - 10a  . metoprolol succinate  50 mg Oral Daily  . pantoprazole  40 mg Oral Daily   Continuous Infusions:  PRN Meds: acetaminophen, nitroGLYCERIN, ondansetron (ZOFRAN) IV   Vital Signs    Vitals:   11/22/19 0551 11/22/19 0939 11/22/19 2031 11/23/19 0636  BP: (!) 135/93 (!) 129/111 (!) 128/91 132/69  Pulse: 84 82 84 83  Resp: 18  18 18   Temp: (!) 97.5 F (36.4 C)  98.2 F (36.8 C) 98.5 F (36.9 C)  TempSrc: Oral  Oral Oral  SpO2: 93%  95% 94%  Weight: 86.3 kg   86.1 kg  Height:       No intake or output data in the 24 hours ending 11/23/19 0746 Last 3 Weights 11/23/2019 11/22/2019 11/21/2019  Weight (lbs) 189 lb 14.4 oz 190 lb 3.2 oz 190 lb 11.2 oz  Weight (kg) 86.138 kg 86.274 kg 86.501 kg      Telemetry    NSR, HR 70-80s, PACs - Personally Reviewed  ECG    No new - Personally Reviewed  Physical Exam   GEN: No acut e distress.   Neck: No JVD Cardiac: RRR, no murmurs, rubs, or gallops.  Respiratory: Clear to auscultation bilaterally. GI: Soft, nontender, non-distended  MS: No edema; No deformity. Neuro:  Nonfocal  Psych: Normal affect   Labs    High Sensitivity Troponin:   Recent Labs  Lab 11/20/19 1230 11/20/19 1439  TROPONINIHS 5 6      Chemistry Recent Labs  Lab 11/20/19 1230 11/21/19 0153  NA 135 139  K 4.0 4.2  CL 102 106  CO2 24 21*  GLUCOSE 109* 104*  BUN 14 16  CREATININE 0.80 0.91  CALCIUM 9.7 9.1  GFRNONAA >60 >60  GFRAA >60 >60  ANIONGAP 9 12     Hematology Recent Labs  Lab 11/20/19 1230  WBC 12.1*    RBC 5.06  HGB 15.5  HCT 45.7  MCV 90.3  MCH 30.6  MCHC 33.9  RDW 13.3  PLT 417*    BNPNo results for input(s): BNP, PROBNP in the last 168 hours.   DDimer No results for input(s): DDIMER in the last 168 hours.   Radiology    CT CORONARY MORPH W/CTA COR W/SCORE W/CA W/CM &/OR WO/CM  Addendum Date: 11/21/2019   ADDENDUM REPORT: 11/21/2019 13:00 EXAM: Cardiac/Coronary  CT TECHNIQUE: The patient was scanned on a 11/23/2019. FINDINGS: A 120 kV prospective scan was triggered in the descending thoracic aorta at 111 HU's. Axial non-contrast 3 mm slices were carried out through the heart. The data set was analyzed on a dedicated work station and scored using the Agatson method. Gantry rotation speed was 250 msecs and collimation was .6 mm. No beta blockade and 0.8 mg of sl NTG was given. The 3D data set was reconstructed in 5% intervals of the 67-82 % of the R-R cycle. Diastolic phases were analyzed on a dedicated work station using MPR, MIP and VRT modes. The patient received 80  cc of contrast. Aorta:  Normal size.  No calcifications.  No dissection. Aortic Valve:  Trileaflet.  No calcifications. Coronary Arteries:  Normal coronary origin.  Right dominance. RCA is a large dominant artery that gives rise to PDA and PLVB. There is moderate non calcified plaque in the ostial RCA with associated stenosis of 50-59% with mildly aneurysmal segment distal to this. There is mild calcified plaque in the proximal RCA with associated stenosis of 25-49%. There is minimal calcified plaque in the mid and distal RCA with associated stenosis of 0-24%. Left main is very short artery that gives rise to LAD and LCX arteries. There is no plaque. LAD is a large vessel. There is moderate mixed plaque with high risk features (HU<30, spotty calcification, napkin ring sign) in the proximal LAD with associated stenosis 50%. There is a mild calcified plaque in the mid LAD with associated stenosis of 25-49%. LCX is a  non-dominant artery that gives rise to one large OM1 branch. There is mild calcified plaque in the proximal and mid OM1 with associated stenosis of 25-49%. The proximal and mid LCx appears normal but the distal LCx definition is poor. Other findings: Normal pulmonary vein drainage into the left atrium. Normal let atrial appendage without a thrombus. Normal size of the pulmonary artery. IMPRESSION: 1. Coronary calcium score of 442. This was 86th percentile for age and sex matched control. 2.  Normal coronary origin with right dominance. 3. Moderate atherosclerosis of the LAD and RCA. CAD-RADS 3. The proximal LAD lesion has high risk features. 4. Consider symptom guided anti-ischemic and preventive pharmacotherapy as well as risk factor modification per guideline-directed care. 5.  This study has been submitted for FFR flow analysis. Armanda Magic Electronically Signed   By: Armanda Magic   On: 11/21/2019 13:00   Result Date: 11/21/2019 EXAM: OVER-READ INTERPRETATION  CT CHEST The following report is an over-read performed by radiologist Dr. Donzetta Kohut of [facility] on 11/21/2019. This over-read does not include interpretation of cardiac or coronary anatomy or pathology. The coronary calcium score/coronary CTA interpretation by the cardiologist is attached. COMPARISON:  None FINDINGS: Vascular: See dedicated report regarding cardiovascular findings. Ascending thoracic aorta at 3.3 cm. Descending thoracic aorta is normal caliber. No pericardial fluid. Mixing of contrast in the pulmonary vascular bed. Mediastinum/Nodes: Limited visualization of the mediastinum without signs of adenopathy. Visualized portions of the esophagus are normal. Lungs/Pleura: Small nodule along the minor fissure in the right chest 5 mm. (Image 5, series 12) 4 mm right middle lobe nodule (image 15, series 12) airways are patent. No consolidation or pleural effusion. Upper Abdomen: Incidental imaging of upper abdominal contents  without acute abnormality. There is mild distal esophageal thickening with small hiatal hernia. Musculoskeletal: No acute bone finding IMPRESSION: 1. No acute findings demonstrated. See dedicated report regarding cardiovascular findings. 2. 5 mm and 3 mm pulmonary nodule in the right chest as described. No follow-up needed if patient is low-risk (and has no known or suspected primary neoplasm). Non-contrast chest CT can be considered in 12 months if patient is high-risk. This recommendation follows the consensus statement: Guidelines for Management of Incidental Pulmonary Nodules Detected on CT Images: From the Fleischner Society 2017; Radiology 2017; 284:228-243. Electronically Signed: By: Donzetta Kohut M.D. On: 11/21/2019 11:59   CT CORONARY FRACTIONAL FLOW RESERVE FLUID ANALYSIS  Result Date: 11/22/2019 EXAM: FFRCT ANALYSIS FINDINGS: FFRct analysis was performed on the original cardiac CT angiogram dataset. Diagrammatic representation of the FFRct analysis is provided in  a separate PDF document in PACS. This dictation was created using the PDF document and an interactive 3D model of the results. 3D model is not available in the EMR/PACS. Normal FFR range is >0.80. 1. Left Main: No significant stenosis. FFR = 1.00. 2. LAD: Significant flow limiting lesion in proximal LAD. Proximal FFR = 0.99, Mid FFR = 0.71, Distal FFR = 0.66. 3. LCX: No significant stenosis. Proximal FFR = 0.99, Mid FFR = 0.96, Distal FFR = 0.93. 4. Ramus: No significant stenosis. Proximal FFR = 0.99, Mid FFR = 0.97, Distal FFR = 0.91. 5. RCA: No significant stenosis. Proximal FFR = 0.96, Mid FFR = 0.95, Distal FFR = 0.92. IMPRESSION: 1. CT FFR flow analysis suggestive of significant flow limiting lesion in the proximal LAD (FFR = 0.69). 2.  Recommend cardiac catheterization for further evaluation. Traci Turner Electronically Signed   By: Traci  Turner   On: 11/22/2019 19:29   ECHOCARDIOGRAM COMPLETE  Result Date: 11/21/2019     ECHOCARDIOGRAM REPORT   Patient Name:   Rahshawn Stephanie Date of Exam: 11/21/2019 Medical Rec #:  [id number]         Height:       66.0 in Accession #:    [id number]        Weight:       190.7 lb Date of Birth:  01/13/1956        BSA:          1.960 m Patient Age:    63 years          BP:           140/95 mmHg Patient Gender: M                 HR:           78 bpm. Exam Location:  Inpatient Procedure: 2D Echo Indications:    Abnormal ECG R94.31  History:        Patient has no prior history of Echocardiogram examinations.                 Signs/Symptoms:Syncope; Risk Factors:Hypertension, Dyslipidemia                 and Current Smoker.  Sonographer:    Casey Kirkpatrick RDCS (AE) Referring Phys: [id number] LINDSAY B ROBERTS IMPRESSIONS  1. Left ventricular ejection fraction, by estimation, is 55 to 60%. The left ventricle has normal function. The left ventricle grossly normal regional wall motion. Left ventricular diastolic parameters are consistent with Grade I diastolic dysfunction (impaired relaxation).  2. Right ventricular systolic function is normal. The right ventricular size is normal. Tricuspid regurgitation signal is inadequate for assessing PA pressure.  3. The mitral valve is normal in structure. No evidence of mitral valve regurgitation. No evidence of mitral stenosis.  4. The aortic valve was not well visualized. Aortic valve regurgitation is trivial. No aortic stenosis is present.  5. Aortic dilatation noted. There is mild dilatation of the aortic root (41 mm) and of the ascending aorta measuring 43 mm.  6. The inferior vena cava is normal in size with greater than 50% respiratory variability, suggesting right atrial pressure of 3 mmHg. FINDINGS  Left Ventricle: Left ventricular ejection fraction, by estimation, is 55 to 60%. The left ventricle has normal function. The left ventricle has grossly normal regional wall motion. The left ventricular internal cavity size was normal in size. There is no left  ventricular hypertrophy. Left ventricular diastolic parameters are consistent with Grade   I diastolic dysfunction (impaired relaxation). Right Ventricle: The right ventricular size is normal. No increase in right ventricular wall thickness. Right ventricular systolic function is normal. Tricuspid regurgitation signal is inadequate for assessing PA pressure. Left Atrium: Left atrial size was normal in size. Right Atrium: Right atrial size was normal in size. Pericardium: There is no evidence of pericardial effusion. Mitral Valve: The mitral valve is normal in structure. Normal mobility of the mitral valve leaflets. No evidence of mitral valve regurgitation. No evidence of mitral valve stenosis. Tricuspid Valve: The tricuspid valve is normal in structure. Tricuspid valve regurgitation is not demonstrated. No evidence of tricuspid stenosis. Aortic Valve: The aortic valve was not well visualized. Aortic valve regurgitation is trivial. No aortic stenosis is present. Pulmonic Valve: The pulmonic valve was not well visualized. Pulmonic valve regurgitation is not visualized. No evidence of pulmonic stenosis. Aorta: Aortic dilatation noted. There is mild dilatation of the aortic root and of the ascending aorta measuring 43 mm. Venous: The inferior vena cava is normal in size with greater than 50% respiratory variability, suggesting right atrial pressure of 3 mmHg. IAS/Shunts: No atrial level shunt detected by color flow Doppler.  LEFT VENTRICLE PLAX 2D LVIDd:         4.00 cm  Diastology LVIDs:         2.70 cm  LV e' lateral:   8.16 cm/s LV PW:         0.95 cm  LV E/e' lateral: 7.5 LV IVS:        1.15 cm  LV e' medial:    7.62 cm/s LVOT diam:     2.15 cm  LV E/e' medial:  8.0 LV SV:         68 LV SV Index:   35 LVOT Area:     3.63 cm  RIGHT VENTRICLE RV S prime:     10.90 cm/s TAPSE (M-mode): 1.6 cm LEFT ATRIUM             Index       RIGHT ATRIUM           Index LA diam:        3.20 cm 1.63 cm/m  RA Area:     13.20 cm LA  Vol (A2C):   50.9 ml 25.97 ml/m RA Volume:   25.30 ml  12.91 ml/m LA Vol (A4C):   27.5 ml 14.03 ml/m LA Biplane Vol: 37.8 ml 19.29 ml/m  AORTIC VALVE LVOT Vmax:   107.00 cm/s LVOT Vmean:  61.600 cm/s LVOT VTI:    0.188 m  AORTA Ao Root diam: 4.10 cm Ao Asc diam:  4.30 cm MITRAL VALVE MV Area (PHT): 2.39 cm    SHUNTS MV Decel Time: 317 msec    Systemic VTI:  0.19 m MV E velocity: 61.30 cm/s  Systemic Diam: 2.15 cm MV A velocity: 65.10 cm/s MV E/A ratio:  0.94 Weston Brass MD Electronically signed by Weston Brass MD Signature Date/Time: 11/21/2019/12:47:35 PM    Final     Cardiac Studies   Echo 11/21/19 1. Left ventricular ejection fraction, by estimation, is 55 to 60%. The  left ventricle has normal function. The left ventricle grossly normal  regional wall motion. Left ventricular diastolic parameters are consistent  with Grade I diastolic dysfunction  (impaired relaxation).  2. Right ventricular systolic function is normal. The right ventricular  size is normal. Tricuspid regurgitation signal is inadequate for assessing  PA pressure.  3. The mitral valve is normal in  structure. No evidence of mitral valve  regurgitation. No evidence of mitral stenosis.  4. The aortic valve was not well visualized. Aortic valve regurgitation  is trivial. No aortic stenosis is present.  5. Aortic dilatation noted. There is mild dilatation of the aortic root  (41 mm) and of the ascending aorta measuring 43 mm.  6. The inferior vena cava is normal in size with greater than 50%  respiratory variability, suggesting right atrial pressure of 3 mmHg.   Patient Profile     64 y.o. male with pmh of HTN, HL, and tobacco use who is being seen for syncope and SVT.  Assessment & Plan    Syncope - In the setting of SVT at 200bpm - Echo stable  SVT - likely cause of syncope - Started on Toprol-XL 25 mg daily - TSH normal - Has not had recurrent SVT  Chest pain - in the setting of SVT - HS  troponin negative x 2 - EKG with TWI V4-V6 - CAD risk factors include tobacco use, HLD, and pre-DM - Cardiac CT showed moderate disease. FFR showed abnormal FFR in the prox LAD. Recommended cath - Plan for cath tomorrow Risks and benefits of cardiac catheterization have been discussed with the patient.  These include bleeding, infection, kidney damage, stroke, heart attack, death.  The patient understands these risks and is willing to proceed.  HTN - Irbesartan 75mg  daily, amlodipine 5mg  daily, Toprol - Pressures reasonable, 132/69  HLD - Atorvastatin 20 mg daily - LDL 70  Diabetes 2 - can consider metformin on  discharge   For questions or updates, please contact [facility] Please consult [email or web address] for contact info under        Signed, Cadence Ninfa Meeker, PA-C  11/23/2019, 7:46 AM    Patient seen and examined with Cadence Furth, PA-C.  Agree as above, with the following exceptions and changes as noted below. He understands that we will proceed with cath tomorrow, and he has no further questions, feels his questions were answered very well by Ms. Kathlen Mody. Gen: NAD, CV: RRR, no murmurs, Lungs: clear, Abd: soft, Extrem: Warm, well perfused, no edema, Neuro/Psych: alert and oriented x 3, normal mood and affect. All available labs, radiology testing, previous records reviewed. He anticipates cath tomorrow. No further SVT. Continue Toprol. HbA1c 6.4, could consider metformin, or SGLT2I with evidence of CAD. INFORMED CONSENT: I have reviewed the risks, indications, and alternatives to cardiac catheterization, possible angioplasty, and stenting with the patient. Risks include but are not limited to bleeding, infection, vascular injury, stroke, myocardial infection, arrhythmia, kidney injury, radiation-related injury in the case of prolonged fluoroscopy use, emergency cardiac surgery, and death. The patient understands the risks of serious complication is 1-2 in 1610 with diagnostic cardiac  cath and 1-2% or less with angioplasty/stenting.   Elouise Munroe 11/23/19 8:52 AM

## 2019-11-23 NOTE — H&P (View-Only) (Signed)
Progress Note  Patient Name: Carlos Valentine Date of Encounter: 11/23/2019  Primary Cardiologist: Armanda Magic, MD   Subjective   Cardiac CT FFR results with flow limiting lesion in the prox LAD. Plan for cath tomorrow. No further chest pain.   Inpatient Medications    Scheduled Meds: . amLODipine  5 mg Oral QHS  . aspirin  81 mg Oral Daily  . atorvastatin  80 mg Oral Daily  . irbesartan  75 mg Oral q morning - 10a  . metoprolol succinate  50 mg Oral Daily  . pantoprazole  40 mg Oral Daily   Continuous Infusions:  PRN Meds: acetaminophen, nitroGLYCERIN, ondansetron (ZOFRAN) IV   Vital Signs    Vitals:   11/22/19 0551 11/22/19 0939 11/22/19 2031 11/23/19 0636  BP: (!) 135/93 (!) 129/111 (!) 128/91 132/69  Pulse: 84 82 84 83  Resp: 18  18 18   Temp: (!) 97.5 F (36.4 C)  98.2 F (36.8 C) 98.5 F (36.9 C)  TempSrc: Oral  Oral Oral  SpO2: 93%  95% 94%  Weight: 86.3 kg   86.1 kg  Height:       No intake or output data in the 24 hours ending 11/23/19 0746 Last 3 Weights 11/23/2019 11/22/2019 11/21/2019  Weight (lbs) 189 lb 14.4 oz 190 lb 3.2 oz 190 lb 11.2 oz  Weight (kg) 86.138 kg 86.274 kg 86.501 kg      Telemetry    NSR, HR 70-80s, PACs - Personally Reviewed  ECG    No new - Personally Reviewed  Physical Exam   GEN: No acut e distress.   Neck: No JVD Cardiac: RRR, no murmurs, rubs, or gallops.  Respiratory: Clear to auscultation bilaterally. GI: Soft, nontender, non-distended  MS: No edema; No deformity. Neuro:  Nonfocal  Psych: Normal affect   Labs    High Sensitivity Troponin:   Recent Labs  Lab 11/20/19 1230 11/20/19 1439  TROPONINIHS 5 6      Chemistry Recent Labs  Lab 11/20/19 1230 11/21/19 0153  NA 135 139  K 4.0 4.2  CL 102 106  CO2 24 21*  GLUCOSE 109* 104*  BUN 14 16  CREATININE 0.80 0.91  CALCIUM 9.7 9.1  GFRNONAA >60 >60  GFRAA >60 >60  ANIONGAP 9 12     Hematology Recent Labs  Lab 11/20/19 1230  WBC 12.1*    RBC 5.06  HGB 15.5  HCT 45.7  MCV 90.3  MCH 30.6  MCHC 33.9  RDW 13.3  PLT 417*    BNPNo results for input(s): BNP, PROBNP in the last 168 hours.   DDimer No results for input(s): DDIMER in the last 168 hours.   Radiology    CT CORONARY MORPH W/CTA COR W/SCORE W/CA W/CM &/OR WO/CM  Addendum Date: 11/21/2019   ADDENDUM REPORT: 11/21/2019 13:00 EXAM: Cardiac/Coronary  CT TECHNIQUE: The patient was scanned on a 11/23/2019. FINDINGS: A 120 kV prospective scan was triggered in the descending thoracic aorta at 111 HU's. Axial non-contrast 3 mm slices were carried out through the heart. The data set was analyzed on a dedicated work station and scored using the Agatson method. Gantry rotation speed was 250 msecs and collimation was .6 mm. No beta blockade and 0.8 mg of sl NTG was given. The 3D data set was reconstructed in 5% intervals of the 67-82 % of the R-R cycle. Diastolic phases were analyzed on a dedicated work station using MPR, MIP and VRT modes. The patient received 80  cc of contrast. Aorta:  Normal size.  No calcifications.  No dissection. Aortic Valve:  Trileaflet.  No calcifications. Coronary Arteries:  Normal coronary origin.  Right dominance. RCA is a large dominant artery that gives rise to PDA and PLVB. There is moderate non calcified plaque in the ostial RCA with associated stenosis of 50-59% with mildly aneurysmal segment distal to this. There is mild calcified plaque in the proximal RCA with associated stenosis of 25-49%. There is minimal calcified plaque in the mid and distal RCA with associated stenosis of 0-24%. Left main is very short artery that gives rise to LAD and LCX arteries. There is no plaque. LAD is a large vessel. There is moderate mixed plaque with high risk features (HU<30, spotty calcification, napkin ring sign) in the proximal LAD with associated stenosis 50%. There is a mild calcified plaque in the mid LAD with associated stenosis of 25-49%. LCX is a  non-dominant artery that gives rise to one large OM1 branch. There is mild calcified plaque in the proximal and mid OM1 with associated stenosis of 25-49%. The proximal and mid LCx appears normal but the distal LCx definition is poor. Other findings: Normal pulmonary vein drainage into the left atrium. Normal let atrial appendage without a thrombus. Normal size of the pulmonary artery. IMPRESSION: 1. Coronary calcium score of 442. This was 86th percentile for age and sex matched control. 2.  Normal coronary origin with right dominance. 3. Moderate atherosclerosis of the LAD and RCA. CAD-RADS 3. The proximal LAD lesion has high risk features. 4. Consider symptom guided anti-ischemic and preventive pharmacotherapy as well as risk factor modification per guideline-directed care. 5.  This study has been submitted for FFR flow analysis. Armanda Magic Electronically Signed   By: Armanda Magic   On: 11/21/2019 13:00   Result Date: 11/21/2019 EXAM: OVER-READ INTERPRETATION  CT CHEST The following report is an over-read performed by radiologist Dr. Donzetta Kohut of [facility] on 11/21/2019. This over-read does not include interpretation of cardiac or coronary anatomy or pathology. The coronary calcium score/coronary CTA interpretation by the cardiologist is attached. COMPARISON:  None FINDINGS: Vascular: See dedicated report regarding cardiovascular findings. Ascending thoracic aorta at 3.3 cm. Descending thoracic aorta is normal caliber. No pericardial fluid. Mixing of contrast in the pulmonary vascular bed. Mediastinum/Nodes: Limited visualization of the mediastinum without signs of adenopathy. Visualized portions of the esophagus are normal. Lungs/Pleura: Small nodule along the minor fissure in the right chest 5 mm. (Image 5, series 12) 4 mm right middle lobe nodule (image 15, series 12) airways are patent. No consolidation or pleural effusion. Upper Abdomen: Incidental imaging of upper abdominal contents  without acute abnormality. There is mild distal esophageal thickening with small hiatal hernia. Musculoskeletal: No acute bone finding IMPRESSION: 1. No acute findings demonstrated. See dedicated report regarding cardiovascular findings. 2. 5 mm and 3 mm pulmonary nodule in the right chest as described. No follow-up needed if patient is low-risk (and has no known or suspected primary neoplasm). Non-contrast chest CT can be considered in 12 months if patient is high-risk. This recommendation follows the consensus statement: Guidelines for Management of Incidental Pulmonary Nodules Detected on CT Images: From the Fleischner Society 2017; Radiology 2017; 284:228-243. Electronically Signed: By: Donzetta Kohut M.D. On: 11/21/2019 11:59   CT CORONARY FRACTIONAL FLOW RESERVE FLUID ANALYSIS  Result Date: 11/22/2019 EXAM: FFRCT ANALYSIS FINDINGS: FFRct analysis was performed on the original cardiac CT angiogram dataset. Diagrammatic representation of the FFRct analysis is provided in  a separate PDF document in PACS. This dictation was created using the PDF document and an interactive 3D model of the results. 3D model is not available in the EMR/PACS. Normal FFR range is >0.80. 1. Left Main: No significant stenosis. FFR = 1.00. 2. LAD: Significant flow limiting lesion in proximal LAD. Proximal FFR = 0.99, Mid FFR = 0.71, Distal FFR = 0.66. 3. LCX: No significant stenosis. Proximal FFR = 0.99, Mid FFR = 0.96, Distal FFR = 0.93. 4. Ramus: No significant stenosis. Proximal FFR = 0.99, Mid FFR = 0.97, Distal FFR = 0.91. 5. RCA: No significant stenosis. Proximal FFR = 0.96, Mid FFR = 0.95, Distal FFR = 0.92. IMPRESSION: 1. CT FFR flow analysis suggestive of significant flow limiting lesion in the proximal LAD (FFR = 0.69). 2.  Recommend cardiac catheterization for further evaluation. Armanda Magicraci Turner Electronically Signed   By: Armanda Magicraci  Turner   On: 11/22/2019 19:29   ECHOCARDIOGRAM COMPLETE  Result Date: 11/21/2019     ECHOCARDIOGRAM REPORT   Patient Name:   Carlos Valentine Date of Exam: 11/21/2019 Medical Rec #:  [id number]         Height:       66.0 in Accession #:    [id number]        Weight:       190.7 lb Date of Birth:  1956-06-02        BSA:          1.960 m Patient Age:    63 years          BP:           140/95 mmHg Patient Gender: M                 HR:           78 bpm. Exam Location:  Inpatient Procedure: 2D Echo Indications:    Abnormal ECG R94.31  History:        Patient has no prior history of Echocardiogram examinations.                 Signs/Symptoms:Syncope; Risk Factors:Hypertension, Dyslipidemia                 and Current Smoker.  Sonographer:    Thurman Coyerasey Kirkpatrick RDCS (AE) Referring Phys: [phone] LINDSAY B ROBERTS IMPRESSIONS  1. Left ventricular ejection fraction, by estimation, is 55 to 60%. The left ventricle has normal function. The left ventricle grossly normal regional wall motion. Left ventricular diastolic parameters are consistent with Grade I diastolic dysfunction (impaired relaxation).  2. Right ventricular systolic function is normal. The right ventricular size is normal. Tricuspid regurgitation signal is inadequate for assessing PA pressure.  3. The mitral valve is normal in structure. No evidence of mitral valve regurgitation. No evidence of mitral stenosis.  4. The aortic valve was not well visualized. Aortic valve regurgitation is trivial. No aortic stenosis is present.  5. Aortic dilatation noted. There is mild dilatation of the aortic root (41 mm) and of the ascending aorta measuring 43 mm.  6. The inferior vena cava is normal in size with greater than 50% respiratory variability, suggesting right atrial pressure of 3 mmHg. FINDINGS  Left Ventricle: Left ventricular ejection fraction, by estimation, is 55 to 60%. The left ventricle has normal function. The left ventricle has grossly normal regional wall motion. The left ventricular internal cavity size was normal in size. There is no left  ventricular hypertrophy. Left ventricular diastolic parameters are consistent with Grade  I diastolic dysfunction (impaired relaxation). Right Ventricle: The right ventricular size is normal. No increase in right ventricular wall thickness. Right ventricular systolic function is normal. Tricuspid regurgitation signal is inadequate for assessing PA pressure. Left Atrium: Left atrial size was normal in size. Right Atrium: Right atrial size was normal in size. Pericardium: There is no evidence of pericardial effusion. Mitral Valve: The mitral valve is normal in structure. Normal mobility of the mitral valve leaflets. No evidence of mitral valve regurgitation. No evidence of mitral valve stenosis. Tricuspid Valve: The tricuspid valve is normal in structure. Tricuspid valve regurgitation is not demonstrated. No evidence of tricuspid stenosis. Aortic Valve: The aortic valve was not well visualized. Aortic valve regurgitation is trivial. No aortic stenosis is present. Pulmonic Valve: The pulmonic valve was not well visualized. Pulmonic valve regurgitation is not visualized. No evidence of pulmonic stenosis. Aorta: Aortic dilatation noted. There is mild dilatation of the aortic root and of the ascending aorta measuring 43 mm. Venous: The inferior vena cava is normal in size with greater than 50% respiratory variability, suggesting right atrial pressure of 3 mmHg. IAS/Shunts: No atrial level shunt detected by color flow Doppler.  LEFT VENTRICLE PLAX 2D LVIDd:         4.00 cm  Diastology LVIDs:         2.70 cm  LV e' lateral:   8.16 cm/s LV PW:         0.95 cm  LV E/e' lateral: 7.5 LV IVS:        1.15 cm  LV e' medial:    7.62 cm/s LVOT diam:     2.15 cm  LV E/e' medial:  8.0 LV SV:         68 LV SV Index:   35 LVOT Area:     3.63 cm  RIGHT VENTRICLE RV S prime:     10.90 cm/s TAPSE (M-mode): 1.6 cm LEFT ATRIUM             Index       RIGHT ATRIUM           Index LA diam:        3.20 cm 1.63 cm/m  RA Area:     13.20 cm LA  Vol (A2C):   50.9 ml 25.97 ml/m RA Volume:   25.30 ml  12.91 ml/m LA Vol (A4C):   27.5 ml 14.03 ml/m LA Biplane Vol: 37.8 ml 19.29 ml/m  AORTIC VALVE LVOT Vmax:   107.00 cm/s LVOT Vmean:  61.600 cm/s LVOT VTI:    0.188 m  AORTA Ao Root diam: 4.10 cm Ao Asc diam:  4.30 cm MITRAL VALVE MV Area (PHT): 2.39 cm    SHUNTS MV Decel Time: 317 msec    Systemic VTI:  0.19 m MV E velocity: 61.30 cm/s  Systemic Diam: 2.15 cm MV A velocity: 65.10 cm/s MV E/A ratio:  0.94 Weston Brass MD Electronically signed by Weston Brass MD Signature Date/Time: 11/21/2019/12:47:35 PM    Final     Cardiac Studies   Echo 11/21/19 1. Left ventricular ejection fraction, by estimation, is 55 to 60%. The  left ventricle has normal function. The left ventricle grossly normal  regional wall motion. Left ventricular diastolic parameters are consistent  with Grade I diastolic dysfunction  (impaired relaxation).  2. Right ventricular systolic function is normal. The right ventricular  size is normal. Tricuspid regurgitation signal is inadequate for assessing  PA pressure.  3. The mitral valve is normal in  structure. No evidence of mitral valve  regurgitation. No evidence of mitral stenosis.  4. The aortic valve was not well visualized. Aortic valve regurgitation  is trivial. No aortic stenosis is present.  5. Aortic dilatation noted. There is mild dilatation of the aortic root  (41 mm) and of the ascending aorta measuring 43 mm.  6. The inferior vena cava is normal in size with greater than 50%  respiratory variability, suggesting right atrial pressure of 3 mmHg.   Patient Profile     64 y.o. male with pmh of HTN, HL, and tobacco use who is being seen for syncope and SVT.  Assessment & Plan    Syncope - In the setting of SVT at 200bpm - Echo stable  SVT - likely cause of syncope - Started on Toprol-XL 25 mg daily - TSH normal - Has not had recurrent SVT  Chest pain - in the setting of SVT - HS  troponin negative x 2 - EKG with TWI V4-V6 - CAD risk factors include tobacco use, HLD, and pre-DM - Cardiac CT showed moderate disease. FFR showed abnormal FFR in the prox LAD. Recommended cath - Plan for cath tomorrow Risks and benefits of cardiac catheterization have been discussed with the patient.  These include bleeding, infection, kidney damage, stroke, heart attack, death.  The patient understands these risks and is willing to proceed.  HTN - Irbesartan 75mg  daily, amlodipine 5mg  daily, Toprol - Pressures reasonable, 132/69  HLD - Atorvastatin 20 mg daily - LDL 70  Diabetes 2 - can consider metformin on  discharge   For questions or updates, please contact [facility] Please consult [email or web address] for contact info under        Signed, Cadence Ninfa Meeker, PA-C  11/23/2019, 7:46 AM    Patient seen and examined with Cadence Furth, PA-C.  Agree as above, with the following exceptions and changes as noted below. He understands that we will proceed with cath tomorrow, and he has no further questions, feels his questions were answered very well by Ms. Kathlen Mody. Gen: NAD, CV: RRR, no murmurs, Lungs: clear, Abd: soft, Extrem: Warm, well perfused, no edema, Neuro/Psych: alert and oriented x 3, normal mood and affect. All available labs, radiology testing, previous records reviewed. He anticipates cath tomorrow. No further SVT. Continue Toprol. HbA1c 6.4, could consider metformin, or SGLT2I with evidence of CAD. INFORMED CONSENT: I have reviewed the risks, indications, and alternatives to cardiac catheterization, possible angioplasty, and stenting with the patient. Risks include but are not limited to bleeding, infection, vascular injury, stroke, myocardial infection, arrhythmia, kidney injury, radiation-related injury in the case of prolonged fluoroscopy use, emergency cardiac surgery, and death. The patient understands the risks of serious complication is 1-2 in 1610 with diagnostic cardiac  cath and 1-2% or less with angioplasty/stenting.   Elouise Munroe 11/23/19 8:52 AM

## 2019-11-24 ENCOUNTER — Encounter (HOSPITAL_COMMUNITY)
Admission: EM | Disposition: A | Discharge status: Home / Self Care | Payer: Self-pay | Source: Ambulatory Visit | Attending: Cardiology

## 2019-11-24 DIAGNOSIS — I2510 Atherosclerotic heart disease of native coronary artery without angina pectoris: Secondary | ICD-10-CM

## 2019-11-24 HISTORY — PX: INTRAVASCULAR ULTRASOUND/IVUS: CATH118244

## 2019-11-24 HISTORY — PX: LEFT HEART CATH AND CORONARY ANGIOGRAPHY: CATH118249

## 2019-11-24 HISTORY — PX: CORONARY STENT INTERVENTION: CATH118234

## 2019-11-24 LAB — BASIC METABOLIC PANEL
Anion gap: 11 (ref 5–15)
BUN: 12 mg/dL (ref 8–23)
CO2: 24 mmol/L (ref 22–32)
Calcium: 9.3 mg/dL (ref 8.9–10.3)
Chloride: 102 mmol/L (ref 98–111)
Creatinine, Ser: 0.8 mg/dL (ref 0.61–1.24)
GFR calc Af Amer: >60 mL/min (ref >60)
GFR calc non Af Amer: >60 mL/min (ref >60)
Glucose, Bld: 105 mg/dL — ABNORMAL HIGH (ref 70–99)
Potassium: 4.4 mmol/L (ref 3.5–5.1)
Sodium: 137 mmol/L (ref 135–145)

## 2019-11-24 LAB — CBC
HCT: 47.1 % (ref 39.0–52.0)
Hemoglobin: 15.8 g/dL (ref 13.0–17.0)
MCH: 30.1 pg (ref 26.0–34.0)
MCHC: 33.5 g/dL (ref 30.0–36.0)
MCV: 89.7 fL (ref 80.0–100.0)
Platelets: 415 10*3/uL — ABNORMAL HIGH (ref 150–400)
RBC: 5.25 MIL/uL (ref 4.22–5.81)
RDW: 12.9 % (ref 11.5–15.5)
WBC: 13.8 10*3/uL — ABNORMAL HIGH (ref 4.0–10.5)
nRBC: 0 % (ref 0.0–0.2)

## 2019-11-24 LAB — POCT ACTIVATED CLOTTING TIME: Activated Clotting Time: 307 seconds

## 2019-11-24 LAB — GLUCOSE, CAPILLARY: Glucose-Capillary: 179 mg/dL — ABNORMAL HIGH (ref 70–99)

## 2019-11-24 SURGERY — LEFT HEART CATH AND CORONARY ANGIOGRAPHY
Anesthesia: LOCAL

## 2019-11-24 MED ORDER — MIDAZOLAM HCL 2 MG/2ML IJ SOLN
INTRAMUSCULAR | Status: DC | PRN
  Administered 2019-11-24: 2 mg via INTRAVENOUS
  Administered 2019-11-24 (×3): 1 mg via INTRAVENOUS

## 2019-11-24 MED ORDER — MIDAZOLAM HCL 2 MG/2ML IJ SOLN
INTRAMUSCULAR | Status: AC
  Filled 2019-11-24: qty 2

## 2019-11-24 MED ORDER — SODIUM CHLORIDE 0.9 % IV SOLN: 250.0000 mL | INTRAVENOUS | Status: DC | PRN

## 2019-11-24 MED ORDER — NITROGLYCERIN 1 MG/10 ML FOR IR/CATH LAB
INTRA_ARTERIAL | Status: AC
  Filled 2019-11-24: qty 10

## 2019-11-24 MED ORDER — FENTANYL CITRATE (PF) 100 MCG/2ML IJ SOLN
INTRAMUSCULAR | Status: AC
  Filled 2019-11-24: qty 2

## 2019-11-24 MED ORDER — LABETALOL HCL 5 MG/ML IV SOLN: 10.0000 mg | INTRAVENOUS | Status: AC | PRN

## 2019-11-24 MED ORDER — SODIUM CHLORIDE 0.9% FLUSH
3.0000 mL | Freq: Two times a day (BID) | INTRAVENOUS | Status: DC
  Administered 2019-11-24 – 2019-11-25 (×2): 3 mL via INTRAVENOUS

## 2019-11-24 MED ORDER — SODIUM CHLORIDE 0.9% FLUSH: 3.0000 mL | INTRAVENOUS | Status: DC | PRN

## 2019-11-24 MED ORDER — NITROGLYCERIN IN D5W 200-5 MCG/ML-% IV SOLN
INTRAVENOUS | Status: AC
  Filled 2019-11-24: qty 250

## 2019-11-24 MED ORDER — FENTANYL CITRATE (PF) 100 MCG/2ML IJ SOLN
INTRAMUSCULAR | Status: DC | PRN
  Administered 2019-11-24: 25 ug via INTRAVENOUS
  Administered 2019-11-24 (×2): 50 ug via INTRAVENOUS
  Administered 2019-11-24: 25 ug via INTRAVENOUS

## 2019-11-24 MED ORDER — HEPARIN (PORCINE) IN NACL 1000-0.9 UT/500ML-% IV SOLN
INTRAVENOUS | Status: AC
  Filled 2019-11-24: qty 1000

## 2019-11-24 MED ORDER — VERAPAMIL HCL 2.5 MG/ML IV SOLN
INTRAVENOUS | Status: AC
  Filled 2019-11-24: qty 2

## 2019-11-24 MED ORDER — DIAZEPAM 5 MG PO TABS: 5.0000 mg | ORAL_TABLET | Freq: Three times a day (TID) | ORAL | Status: DC | PRN

## 2019-11-24 MED ORDER — SODIUM CHLORIDE 0.9% FLUSH: 3.0000 mL | Freq: Two times a day (BID) | INTRAVENOUS | Status: DC

## 2019-11-24 MED ORDER — HEPARIN (PORCINE) IN NACL 1000-0.9 UT/500ML-% IV SOLN
INTRAVENOUS | Status: DC | PRN
  Administered 2019-11-24 (×2): 500 mL

## 2019-11-24 MED ORDER — TICAGRELOR 90 MG PO TABS
ORAL_TABLET | ORAL | Status: AC
  Filled 2019-11-24: qty 2

## 2019-11-24 MED ORDER — SODIUM CHLORIDE 0.9 % WEIGHT BASED INFUSION
3.0000 mL/kg/h | INTRAVENOUS | Status: AC
  Administered 2019-11-24: 3 mL/kg/h via INTRAVENOUS

## 2019-11-24 MED ORDER — MORPHINE SULFATE (PF) 2 MG/ML IV SOLN: 2.0000 mg | INTRAVENOUS | Status: DC | PRN

## 2019-11-24 MED ORDER — VERAPAMIL HCL 2.5 MG/ML IV SOLN
INTRAVENOUS | Status: DC | PRN
  Administered 2019-11-24: 10 mL via INTRA_ARTERIAL

## 2019-11-24 MED ORDER — HEPARIN SODIUM (PORCINE) 1000 UNIT/ML IJ SOLN
INTRAMUSCULAR | Status: DC | PRN
  Administered 2019-11-24: 3000 [IU] via INTRAVENOUS
  Administered 2019-11-24: 5000 [IU] via INTRAVENOUS

## 2019-11-24 MED ORDER — SODIUM CHLORIDE 0.9 % WEIGHT BASED INFUSION
1.0000 mL/kg/h | INTRAVENOUS | Status: DC
  Administered 2019-11-24: 1 mL/kg/h via INTRAVENOUS

## 2019-11-24 MED ORDER — ASPIRIN 81 MG PO CHEW
81.0000 mg | CHEWABLE_TABLET | ORAL | Status: AC
  Administered 2019-11-24: 81 mg via ORAL

## 2019-11-24 MED ORDER — LIDOCAINE HCL (PF) 1 % IJ SOLN
INTRAMUSCULAR | Status: AC
  Filled 2019-11-24: qty 30

## 2019-11-24 MED ORDER — TICAGRELOR 90 MG PO TABS
ORAL_TABLET | ORAL | Status: DC | PRN
  Administered 2019-11-24: 180 mg via ORAL

## 2019-11-24 MED ORDER — HYDRALAZINE HCL 20 MG/ML IJ SOLN: 10.0000 mg | INTRAMUSCULAR | Status: AC | PRN

## 2019-11-24 MED ORDER — OXYCODONE HCL 5 MG PO TABS: 5.0000 mg | ORAL_TABLET | ORAL | Status: DC | PRN

## 2019-11-24 MED ORDER — TICAGRELOR 90 MG PO TABS
90.0000 mg | ORAL_TABLET | Freq: Two times a day (BID) | ORAL | Status: DC
  Administered 2019-11-24 – 2019-11-25 (×2): 90 mg via ORAL
  Filled 2019-11-24 (×2): qty 1

## 2019-11-24 MED ORDER — HEPARIN SODIUM (PORCINE) 1000 UNIT/ML IJ SOLN
INTRAMUSCULAR | Status: AC
  Filled 2019-11-24: qty 1

## 2019-11-24 MED ORDER — NITROGLYCERIN 1 MG/10 ML FOR IR/CATH LAB
INTRA_ARTERIAL | Status: DC | PRN
  Administered 2019-11-24: 150 ug via INTRACORONARY
  Administered 2019-11-24: 100 ug via INTRACORONARY

## 2019-11-24 MED ORDER — IOHEXOL 350 MG/ML SOLN
INTRAVENOUS | Status: DC | PRN
  Administered 2019-11-24: 115 mL via INTRA_ARTERIAL

## 2019-11-24 MED ORDER — LIDOCAINE HCL (PF) 1 % IJ SOLN
INTRAMUSCULAR | Status: DC | PRN
  Administered 2019-11-24: 2 mL via INTRADERMAL

## 2019-11-24 MED ORDER — NITROGLYCERIN IN D5W 200-5 MCG/ML-% IV SOLN
INTRAVENOUS | Status: AC | PRN
  Administered 2019-11-24: 10 ug/min via INTRAVENOUS

## 2019-11-24 MED ORDER — SODIUM CHLORIDE 0.9 % WEIGHT BASED INFUSION
1.0000 mL/kg/h | INTRAVENOUS | Status: AC
  Administered 2019-11-24: 1 mL/kg/h via INTRAVENOUS

## 2019-11-24 SURGICAL SUPPLY — 19 items
BALLN  ~~LOC~~ SAPPHIRE 4.5X12 (BALLOONS) ×1
BALLN ~~LOC~~ SAPPHIRE 4.5X12 (BALLOONS) ×1
BALLOON ~~LOC~~ SAPPHIRE 4.5X12 (BALLOONS) IMPLANT
CATH 5FR JL3.5 JR4 ANG PIG MP (CATHETERS) ×1 IMPLANT
CATH OPTICROSS 40MHZ (CATHETERS) ×1 IMPLANT
CATH VISTA GUIDE 6FR XBLAD3.5 (CATHETERS) ×1 IMPLANT
DEVICE RAD COMP TR BAND LRG (VASCULAR PRODUCTS) ×1 IMPLANT
GLIDESHEATH SLEND SS 6F .021 (SHEATH) ×1 IMPLANT
GUIDEWIRE INQWIRE 1.5J.035X260 (WIRE) IMPLANT
INQWIRE 1.5J .035X260CM (WIRE) ×2
KIT ENCORE 26 ADVANTAGE (KITS) ×1 IMPLANT
KIT HEART LEFT (KITS) ×2 IMPLANT
PACK CARDIAC CATHETERIZATION (CUSTOM PROCEDURE TRAY) ×2 IMPLANT
SLED PULL BACK IVUS (MISCELLANEOUS) ×1 IMPLANT
STENT RESOLUTE ONYX 4.0X15 (Permanent Stent) ×1 IMPLANT
SYR MEDRAD MARK 7 150ML (SYRINGE) ×2 IMPLANT
TRANSDUCER W/STOPCOCK (MISCELLANEOUS) ×2 IMPLANT
TUBING CIL FLEX 10 FLL-RA (TUBING) ×2 IMPLANT
WIRE COUGAR XT STRL 190CM (WIRE) ×1 IMPLANT

## 2019-11-24 NOTE — Interval H&P Note (Signed)
Cath Lab Visit (complete for each Cath Lab visit)  Clinical Evaluation Leading to the Procedure:   ACS: No.  Non-ACS:    Anginal Classification: CCS II  Anti-ischemic medical therapy: Maximal Therapy (2 or more classes of medications)  Non-Invasive Test Results: Intermediate-risk stress test findings: cardiac mortality 1-3%/year  Prior CABG: No previous CABG      History and Physical Interval Note:  11/24/2019 7:40 AM  Carlos Valentine  has presented today for surgery, with the diagnosis of unstable angina, syncope.  The various methods of treatment have been discussed with the patient and family. After consideration of risks, benefits and other options for treatment, the patient has consented to  Procedure(s): LEFT HEART CATH AND CORONARY ANGIOGRAPHY (N/A) as a surgical intervention.  The patient's history has been reviewed, patient examined, no change in status, stable for surgery.  I have reviewed the patient's chart and labs.  Questions were answered to the patient's satisfaction.     Tonny Bollman

## 2019-11-24 NOTE — Progress Notes (Signed)
TR BAND REMOVAL  LOCATION:  right radial  DEFLATED PER PROTOCOL:  Yes.    TIME BAND OFF / DRESSING APPLIED:   1623   SITE UPON ARRIVAL:   Level 0  SITE AFTER BAND REMOVAL:  Level 0  CIRCULATION SENSATION AND MOVEMENT:  Within Normal Limits  Yes.    COMMENTS:

## 2019-11-25 DIAGNOSIS — I2510 Atherosclerotic heart disease of native coronary artery without angina pectoris: Secondary | ICD-10-CM

## 2019-11-25 LAB — CBC
HCT: 45.7 % (ref 39.0–52.0)
Hemoglobin: 15.5 g/dL (ref 13.0–17.0)
MCH: 30.1 pg (ref 26.0–34.0)
MCHC: 33.9 g/dL (ref 30.0–36.0)
MCV: 88.7 fL (ref 80.0–100.0)
Platelets: 410 10*3/uL — ABNORMAL HIGH (ref 150–400)
RBC: 5.15 MIL/uL (ref 4.22–5.81)
RDW: 12.8 % (ref 11.5–15.5)
WBC: 18.4 10*3/uL — ABNORMAL HIGH (ref 4.0–10.5)
nRBC: 0 % (ref 0.0–0.2)

## 2019-11-25 LAB — BASIC METABOLIC PANEL
Anion gap: 8 (ref 5–15)
BUN: 13 mg/dL (ref 8–23)
CO2: 25 mmol/L (ref 22–32)
Calcium: 9.2 mg/dL (ref 8.9–10.3)
Chloride: 104 mmol/L (ref 98–111)
Creatinine, Ser: 0.84 mg/dL (ref 0.61–1.24)
GFR calc Af Amer: >60 mL/min (ref >60)
GFR calc non Af Amer: >60 mL/min (ref >60)
Glucose, Bld: 121 mg/dL — ABNORMAL HIGH (ref 70–99)
Potassium: 4 mmol/L (ref 3.5–5.1)
Sodium: 137 mmol/L (ref 135–145)

## 2019-11-25 MED ORDER — NITROGLYCERIN 0.4 MG SL SUBL: 0.4000 mg | SUBLINGUAL_TABLET | SUBLINGUAL | 0 refills | Status: DC | PRN

## 2019-11-25 MED ORDER — PANTOPRAZOLE SODIUM 40 MG PO TBEC: 40.0000 mg | DELAYED_RELEASE_TABLET | Freq: Every day | ORAL | 3 refills | Status: DC

## 2019-11-25 MED ORDER — METOPROLOL SUCCINATE ER 50 MG PO TB24: 50.0000 mg | ORAL_TABLET | Freq: Every day | ORAL | 3 refills | Status: DC

## 2019-11-25 MED ORDER — TICAGRELOR 90 MG PO TABS: 90.0000 mg | ORAL_TABLET | Freq: Two times a day (BID) | ORAL | 3 refills | Status: DC

## 2019-11-25 MED ORDER — ATORVASTATIN CALCIUM 80 MG PO TABS: 80.0000 mg | ORAL_TABLET | Freq: Every day | ORAL | 3 refills | Status: DC

## 2019-11-25 MED FILL — METOPROLOL SUCCINATE ER 50: 50 | 30 days supply | Qty: 30 | Fill #0

## 2019-11-25 MED FILL — NITROGLYCERIN 0.4 MG TAB SL: 0.4 | 7 days supply | Qty: 25 | Fill #0

## 2019-11-25 MED FILL — PANTOPRAZOLE SOD DR 40 MG T: 40 | 30 days supply | Qty: 30 | Fill #0

## 2019-11-25 MED FILL — ATORVASTATIN CALCIUM 80 MG: 80 | 30 days supply | Qty: 30 | Fill #0

## 2019-11-25 MED FILL — BRILINTA 90 MG TABLET: 90 | 30 days supply | Qty: 60 | Fill #0

## 2019-11-25 NOTE — Care Management (Signed)
Per Carlos Valentine. W/ [facility] : co-pay amount for Brilinta 65m bid for a 30 day supply $250.00 at a retail pharmacy.  90 day supply mail order([facility]) for Brilinta 90 mg bid for a 90 day supply $750.00.  No PA required Deductible not met $250.00 Tier 4 Retail pharmacy: [facility].  Ref.# [id number]

## 2019-11-25 NOTE — Care Management (Signed)
11-25-19 1140 Case Manager discussed co pay and deductible with patient- patient states he will be able to pay the first month of $250.00 to meet the deductible. After that the patient's deductible is met-co pay should be either a lesser amount vs zero.

## 2019-11-25 NOTE — Progress Notes (Signed)
Progress Note  Patient Name: Carlos Valentine Date of Encounter: 11/25/2019  Primary Cardiologist: Armanda Magic, MD   Subjective   Feels well, cath site stable, no significant symptomatic SVT  Inpatient Medications    Scheduled Meds: . amLODipine  5 mg Oral QHS  . aspirin  81 mg Oral Daily  . atorvastatin  80 mg Oral Daily  . irbesartan  75 mg Oral q morning - 10a  . metoprolol succinate  50 mg Oral Daily  . pantoprazole  40 mg Oral Daily  . sodium chloride flush  3 mL Intravenous Q12H  . ticagrelor  90 mg Oral BID   Continuous Infusions: . sodium chloride     PRN Meds: sodium chloride, acetaminophen, calcium carbonate, diazepam, morphine injection, nitroGLYCERIN, ondansetron (ZOFRAN) IV, oxyCODONE, sodium chloride flush   Vital Signs    Vitals:   11/24/19 1810 11/24/19 2110 11/25/19 0407 11/25/19 0806  BP: 117/85 131/81 122/78 116/85  Pulse:   88 77  Resp: 19 (!) 24 (!) 23 19  Temp:   98.1 F (36.7 C)   TempSrc:   Oral   SpO2: 91% (!) 89% 93% 95%  Weight:   85.6 kg   Height:        Intake/Output Summary (Last 24 hours) at 11/25/2019 1015 Last data filed at 11/25/2019 0807 Gross per 24 hour  Intake 480 ml  Output 400 ml  Net 80 ml   Last 3 Weights 11/25/2019 11/24/2019 11/23/2019  Weight (lbs) 188 lb 12.8 oz 190 lb 189 lb 14.4 oz  Weight (kg) 85.639 kg 86.183 kg 86.138 kg      Telemetry    6 beat run of SVT, otherwise SR. PACs noted on Tele in room - Personally Reviewed  ECG    SR, lateral T wave inversions unchanged from pre cath ECG - Personally Reviewed  Physical Exam   GEN: No acute distress.   Neck: No JVD Cardiac: RRR, no murmurs, rubs, or gallops. Radial cath site stable.  Respiratory: Clear to auscultation bilaterally. GI: Soft, nontender, non-distended  MS: No edema; No deformity. Neuro:  Nonfocal  Psych: Normal affect   Labs    High Sensitivity Troponin:   Recent Labs  Lab 11/20/19 1230 11/20/19 1439  TROPONINIHS 5 6       Chemistry Recent Labs  Lab 11/21/19 0153 11/24/19 0515 11/25/19 0319  NA 139 137 137  K 4.2 4.4 4.0  CL 106 102 104  CO2 21* 24 25  GLUCOSE 104* 105* 121*  BUN 16 12 13   CREATININE 0.91 0.80 0.84  CALCIUM 9.1 9.3 9.2  GFRNONAA >60 >60 >60  GFRAA >60 >60 >60  ANIONGAP 12 11 8      Hematology Recent Labs  Lab 11/20/19 1230 11/24/19 0515 11/25/19 0319  WBC 12.1* 13.8* 18.4*  RBC 5.06 5.25 5.15  HGB 15.5 15.8 15.5  HCT 45.7 47.1 45.7  MCV 90.3 89.7 88.7  MCH 30.6 30.1 30.1  MCHC 33.9 33.5 33.9  RDW 13.3 12.9 12.8  PLT 417* 415* 410*    BNPNo results for input(s): BNP, PROBNP in the last 168 hours.   DDimer No results for input(s): DDIMER in the last 168 hours.   Radiology    CARDIAC CATHETERIZATION  Result Date: 11/24/2019 1. Diffuse nonobstructive CAD in the left circumflex and RCA with associated ectasia in the RCA 2. Severe proximal LAD stenosis correlating with CT-FFR positive lesion, treated with a 4.0x15 mm Resolute Onyx DES using IVUS guidance 3. Known normal LV  systolic function by noninvasive assessment Small diagonal side-branch occlusion post-PCI with significant associated chest pain and ST change. Vessel < 1.5 mm in diameter not suitable for PCI. Recommend aggressive medical therapy for secondary risk reduction.   Cardiac Studies  Cath as above  Patient Profile     64 y.o. male with HTN, HLD, tobacco use, with syncope from SVT (long RP tachycardia) and chest pain, found to have an LAD lesions by CCTA, confirmed by cath, severe prox LAD stenosis now with 1 DES.  Assessment & Plan    Syncope - In the setting of SVT at 200bpm - Echo stable -no recurrent syncope in hospital.   SVT -symptomatic with dizziness in hospital. - appears to be long RP tachycardia.  - no features of preexcitation.  - metoprolol succinate 50 mg daily. - will refer to EP as outpatient for consideration of ablation  Chest pain - in the setting of SVT - HS troponin  negative x 2 - EKG with TWI V4-V6 - CAD risk factors include tobacco use, HLD, and pre-DM - Cardiac CT showed moderate disease. FFR showed abnormal FFR in the prox LAD. Recommended cath - CCTA and cath showed prox LAD disease, received 1 DES. - continue ASA and brilinta. I have instructed the patient that dual antiplatelet therapy should be taken for 1 year without interruption.  We have discussed the consequences of interrupted dual antiplatelet therapy and the risk for in-stent thrombosis.   HTN - Irbesartan 75mg  daily, amlodipine 5mg  daily, Toprol 50 mg daily - BP stable.  HLD - Atorvastatin 20 mg daily - LDL 70  Diabetes 2 - can consider metformin on  discharge, needs PCP follow up. Consider SGLT2I.  F/u TOC in office 1-2 weeks. F/u with EP next available.    For questions or updates, please contact [place] Please consult [email or web address] for contact info under        Signed, Elouise Munroe, MD  11/25/2019, 10:15 AM

## 2019-11-25 NOTE — Progress Notes (Signed)
Patient reported having runs of VTach, yesterday evening and again during night; asymptomatic. Strips saved per Monitor technician for cardiologist review.

## 2019-11-25 NOTE — Care Management (Signed)
11-25-19 Benefits Check submitted for Brilinta. Case Manager will follow for cost. Graves-Bigelow, Lamar Laundry, RN,BSN Case Manager

## 2019-11-25 NOTE — Progress Notes (Signed)
CARDIAC REHAB PHASE I   PRE:  Rate/Rhythm: 77 SR  BP:  Sitting: 116/85      SaO2: 91 RA  MODE:  Ambulation: 470 ft   POST:  Rate/Rhythm: 88 SR  BP:  Sitting: 134/90    SaO2: 92 RA  Pt ambulated 452ft in hallway independently with steady gait. Pt denies CP, SOB, or dizziness. Pt educated on importance of ASA, Brilinta, and statin. Pt given heart healthy and diabetic diets. Reviewed importance of site care, restrictions, and exercise guidelines. Spoke with pt about importance of smoking cessation, states he will try to do it slowly, tip sheet provided. Will refer to CRP [facility].   [id number] Reynold Bowen, RN BSN 11/25/2019 9:03 AM

## 2019-11-25 NOTE — Discharge Summary (Signed)
Discharge Summary    Patient ID: Carlos Valentine MRN: [id number]; DOB: 1956-04-10  Admit date: 11/20/2019 Discharge date: 11/25/2019  Primary Care Provider: Emeterio Reeve, DO  Primary Cardiologist: Fransico Him, MD   Discharge Diagnoses    Principal Problem:   CAD S/P percutaneous coronary angioplasty Active Problems:   Tobacco use   Prediabetes   SVT (supraventricular tachycardia) (HCC)   Syncope   Syncope and collapse   Essential hypertension   Chest pain of uncertain etiology   Palpitations  Diagnostic Studies/Procedures    Echocardiogram 11/21/19:  1. Left ventricular ejection fraction, by estimation, is 55 to 60%. The  left ventricle has normal function. The left ventricle grossly normal  regional wall motion. Left ventricular diastolic parameters are consistent  with Grade I diastolic dysfunction  (impaired relaxation).  2. Right ventricular systolic function is normal. The right ventricular  size is normal. Tricuspid regurgitation signal is inadequate for assessing  PA pressure.  3. The mitral valve is normal in structure. No evidence of mitral valve  regurgitation. No evidence of mitral stenosis.  4. The aortic valve was not well visualized. Aortic valve regurgitation  is trivial. No aortic stenosis is present.  5. Aortic dilatation noted. There is mild dilatation of the aortic root  (41 mm) and of the ascending aorta measuring 43 mm.  6. The inferior vena cava is normal in size with greater than 50%  respiratory variability, suggesting right atrial pressure of 3 mmHg.  _____________   LHC 11/24/19:  1. Diffuse nonobstructive CAD in the left circumflex and RCA with associated ectasia in the RCA 2. Severe proximal LAD stenosis correlating with CT-FFR positive lesion, treated with a 4.0x15 mm Resolute Onyx DES using IVUS guidance 3. Known normal LV systolic function by noninvasive assessment  Small diagonal side-branch occlusion post-PCI with  significant associated chest pain and ST change. Vessel < 1.5 mm in diameter not suitable for PCI.  Recommend aggressive medical therapy for secondary risk reduction.  History of Present Illness     Carlos Valentine is a 64 y.o. male with pmh HTN, HLD, ongoing tobacco use who was seen in the ED for syncope, chest pain, dizziness, and palpitations. 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 He was at work when he got very upset. He went to take a break and eat lunch. He took 2 bites of sandwich and felt his heart racing and the next thing he knew he was on the floor. After waking up he felt chest pain and EMS was called. He had never experienced these symptoms in the past. EMS recommended evaluation in the ED but the patient declined. The patient's wife brought him initially to an urgent care who also recommended ED evaluation.   Hospital Course    The patient was taken to [facility] where he had palpitations again and EKG was obtained showing SVT at 200 bpm. He had recurrent chest pain with radiation to his left shoulder and dizziness but no syncope. He spontaneously converted to NSR and was transferred to [facility] for admission to cardiology service. Follow-up EKG after conversion showed NSR with PACs and T wave inversions. His home Avapro 75 mg daily and amlodipine 5 mg daily were continued on admission. He was started on Toprol 25 mg daily. It was though his syncope was related to decreased cerebral perfusion from reduced cardiac output during SVT. Echo showed EF 55-60%, normal wall motion, G1DD, normal RV  function, trivial AR, aortic dilation 41 mm. Labs showed HS troponin 5>6. TSH normal. LDL came back at 70. A1C 6.4%. A cardiac CT was ordered to evaluate for CAD which showed coronary calcium score 442, 86th percentile, normal coronary origin with right dominance, moderate atherosclerosis of the LAD and RCA, possible high risk features in  the LAD lesion. FFR was abnormal in the proximal LAD with FFR of around 0.7. LHC was recommended for Monday 11/24/19. LHC showed diffuse nonobstructive CAD in the left circumflex and RCA with associated ectasia in the RCA. Severe proximal LAD stenosis correlating with CT-FFR positive lesion, treated with a 4.0x15 mm Resolute Onyx DES using IVUS guidance. There was a small diagonal side-branch occlusion post-PCI with significant associated chest pain and ST change. Vessel < 1.5 mm in diameter not suitable for PCI. Recommendations were for aggressive medical therapy for secondary risk reduction and uninterrupted DAPT with ASA and Brilinta  BID for a minimum of 12 months.  His statin was increased to Lipitor 80 mg daily. Recommended follow-up with primary care for diabetes management.   Medication plan: ASA 81daily, Brilinta  BID, Toprol XL  PO QD.   Hospital problems include:  Syncope -Likely occurred due to decreased cerebral perfusion in setting of SVT at 200 bpm. -No acute findings on echo -No further syncopal episodes and no further episodes of SVT on telemetry during hospital course   SVT -Likely cause of his syncope -Started on Toprol-XL 50 mg daily -TSH was normal at 0.823 -Electrolytes in normal range -Maintaining sinus rhythm, no further SVT on telemetry -Will plan new patient evaluation with EP, Dr. Ladona Ridgel  -Dr. Ladona Ridgel reviewed EKG/tele to evaluate for high risk arrhythmia   Chest pain -No further chest pain since his episode of SVT. -Chest pain in the setting of SVT, possibly related to demand ischemia. -High-sensitivity troponin negative x2 -EKG with T wave inversions in V4-V6 -CVD risk factors include ongoing tobacco use, hyperlipidemia and prediabetesuntreated -Given his risk factors as well as syncope and chest pain in the setting of SVT, CCTA performed which showed moderate disease, awaiting results of FFR and re-measuring of aorta given mild dilation noted on  echo.  -LHC performed 11/24/19 with results as described above   Hypertension -He was continued on irbesartan 75 mg and amlodipine 5 mg from home with addition of beta-blocker. -Blood pressure is still mildly elevated 140/86.  -May need further medication titration in the OP setting   Hyperlipidemia -On atorvastatin 20 mg daily.Lipid panel done today showed LDL of 70. Continue current therapy.  Diabetes type 2 -A1c 6.4% -Would consider adding Metformin 500 mg twice daily.Will need follow-up with primary care.  Possible Ascending aorta dilation -  -Review with CT reading physician to reevaluate measurements with CT, given that echo showed dilation. Cross sectional measurements to confirm.  Consultants: None   The patient was seen and examined by Dr. Jacques Navy who feels that he is stable and ready for discharge today, 11/25/19. He has ambulated with cardiac rehabilitation with anginal symptoms. Cath site stable.   Did the patient have an acute coronary syndrome (MI, NSTEMI, STEMI, etc) this admission?:  Yes                               AHA/ACC Clinical Performance & Quality Measures: 1. Aspirin prescribed? - Yes 2. ADP Receptor Inhibitor (Plavix/Clopidogrel, Brilinta/Ticagrelor or Effient/Prasugrel) prescribed (includes medically managed patients)? - Yes 3. Beta Blocker prescribed? -  Yes 4. High Intensity Statin (Lipitor 40-80mg  or Crestor 20-40mg ) prescribed? - Yes 5. EF assessed during THIS hospitalization? - Yes 6. For EF <40%, was ACEI/ARB prescribed? - Not Applicable (EF >/= 40%) 7. For EF <40%, Aldosterone Antagonist (Spironolactone or Eplerenone) prescribed? - Not Applicable (EF >/= 40%) 8. Cardiac Rehab Phase II ordered (Included Medically managed Patients)? - Yes   _____________  Discharge Vitals Blood pressure 116/85, pulse 77, temperature 98.1 F (36.7 C), temperature source Oral, resp. rate 19, height 5\' 6"  (1.676 m), weight 85.6 kg, SpO2 95 %.  Filed Weights    11/23/19 0636 11/24/19 0544 11/25/19 0407  Weight: 86.1 kg 86.2 kg 85.6 kg   Labs & Radiologic Studies    CBC Recent Labs    11/24/19 0515 11/25/19 0319  WBC 13.8* 18.4*  HGB 15.8 15.5  HCT 47.1 45.7  MCV 89.7 88.7  PLT 415* 410*   Basic Metabolic Panel Recent Labs    11/27/19 0515 11/25/19 0319  NA 137 137  K 4.4 4.0  CL 102 104  CO2 24 25  GLUCOSE 105* 121*  BUN 12 13  CREATININE 0.80 0.84  CALCIUM 9.3 9.2   Liver Function Tests No results for input(s): AST, ALT, ALKPHOS, BILITOT, PROT, ALBUMIN in the last 72 hours. No results for input(s): LIPASE, AMYLASE in the last 72 hours. High Sensitivity Troponin:   Recent Labs  Lab 11/20/19 1230 11/20/19 1439  TROPONINIHS 5 6    BNP Invalid input(s): POCBNP D-Dimer No results for input(s): DDIMER in the last 72 hours. Hemoglobin A1C No results for input(s): HGBA1C in the last 72 hours. Fasting Lipid Panel No results for input(s): CHOL, HDL, LDLCALC, TRIG, CHOLHDL, LDLDIRECT in the last 72 hours. Thyroid Function Tests No results for input(s): TSH, T4TOTAL, T3FREE, THYROIDAB in the last 72 hours.  Invalid input(s): FREET3 _____________  CARDIAC CATHETERIZATION  Result Date: 11/24/2019 1. Diffuse nonobstructive CAD in the left circumflex and RCA with associated ectasia in the RCA 2. Severe proximal LAD stenosis correlating with CT-FFR positive lesion, treated with a 4.0x15 mm Resolute Onyx DES using IVUS guidance 3. Known normal LV systolic function by noninvasive assessment Small diagonal side-branch occlusion post-PCI with significant associated chest pain and ST change. Vessel < 1.5 mm in diameter not suitable for PCI. Recommend aggressive medical therapy for secondary risk reduction.  CT CORONARY MORPH W/CTA COR W/SCORE W/CA W/CM &/OR WO/CM  Addendum Date: 11/21/2019   ADDENDUM REPORT: 11/21/2019 13:00 EXAM: Cardiac/Coronary  CT TECHNIQUE: The patient was scanned on a 11/23/2019. FINDINGS: A 120 kV  prospective scan was triggered in the descending thoracic aorta at 111 HU's. Axial non-contrast 3 mm slices were carried out through the heart. The data set was analyzed on a dedicated work station and scored using the Agatson method. Gantry rotation speed was 250 msecs and collimation was .6 mm. No beta blockade and 0.8 mg of sl NTG was given. The 3D data set was reconstructed in 5% intervals of the 67-82 % of the R-R cycle. Diastolic phases were analyzed on a dedicated work station using MPR, MIP and VRT modes. The patient received 80 cc of contrast. Aorta:  Normal size.  No calcifications.  No dissection. Aortic Valve:  Trileaflet.  No calcifications. Coronary Arteries:  Normal coronary origin.  Right dominance. RCA is a large dominant artery that gives rise to PDA and PLVB. There is moderate non calcified plaque in the ostial RCA with associated stenosis of 50-59% with mildly aneurysmal segment distal  to this. There is mild calcified plaque in the proximal RCA with associated stenosis of 25-49%. There is minimal calcified plaque in the mid and distal RCA with associated stenosis of 0-24%. Left main is very short artery that gives rise to LAD and LCX arteries. There is no plaque. LAD is a large vessel. There is moderate mixed plaque with high risk features (HU<30, spotty calcification, napkin ring sign) in the proximal LAD with associated stenosis 50%. There is a mild calcified plaque in the mid LAD with associated stenosis of 25-49%. LCX is a non-dominant artery that gives rise to one large OM1 branch. There is mild calcified plaque in the proximal and mid OM1 with associated stenosis of 25-49%. The proximal and mid LCx appears normal but the distal LCx definition is poor. Other findings: Normal pulmonary vein drainage into the left atrium. Normal let atrial appendage without a thrombus. Normal size of the pulmonary artery. IMPRESSION: 1. Coronary calcium score of 442. This was 86th percentile for age and sex  matched control. 2.  Normal coronary origin with right dominance. 3. Moderate atherosclerosis of the LAD and RCA. CAD-RADS 3. The proximal LAD lesion has high risk features. 4. Consider symptom guided anti-ischemic and preventive pharmacotherapy as well as risk factor modification per guideline-directed care. 5.  This study has been submitted for FFR flow analysis. Armanda Magic Electronically Signed   By: Armanda Magic   On: 11/21/2019 13:00   Result Date: 11/21/2019 EXAM: OVER-READ INTERPRETATION  CT CHEST The following report is an over-read performed by radiologist Dr. Donzetta Kohut of [facility] on 11/21/2019. This over-read does not include interpretation of cardiac or coronary anatomy or pathology. The coronary calcium score/coronary CTA interpretation by the cardiologist is attached. COMPARISON:  None FINDINGS: Vascular: See dedicated report regarding cardiovascular findings. Ascending thoracic aorta at 3.3 cm. Descending thoracic aorta is normal caliber. No pericardial fluid. Mixing of contrast in the pulmonary vascular bed. Mediastinum/Nodes: Limited visualization of the mediastinum without signs of adenopathy. Visualized portions of the esophagus are normal. Lungs/Pleura: Small nodule along the minor fissure in the right chest 5 mm. (Image 5, series 12) 4 mm right middle lobe nodule (image 15, series 12) airways are patent. No consolidation or pleural effusion. Upper Abdomen: Incidental imaging of upper abdominal contents without acute abnormality. There is mild distal esophageal thickening with small hiatal hernia. Musculoskeletal: No acute bone finding IMPRESSION: 1. No acute findings demonstrated. See dedicated report regarding cardiovascular findings. 2. 5 mm and 3 mm pulmonary nodule in the right chest as described. No follow-up needed if patient is low-risk (and has no known or suspected primary neoplasm). Non-contrast chest CT can be considered in 12 months if patient is high-risk.  This recommendation follows the consensus statement: Guidelines for Management of Incidental Pulmonary Nodules Detected on CT Images: From the Fleischner Society 2017; Radiology 2017; 284:228-243. Electronically Signed: By: Donzetta Kohut M.D. On: 11/21/2019 11:59   DG Chest Port 1 View  Result Date: 11/20/2019 CLINICAL DATA:  Syncope and tachycardic today. EXAM: PORTABLE CHEST 1 VIEW COMPARISON:  None. FINDINGS: Patient slightly rotated to the left. Lungs are adequately inflated without focal airspace consolidation or effusion. Cardiomediastinal silhouette and remainder of the exam is unremarkable. IMPRESSION: No active disease. Electronically Signed   By: Elberta Fortis M.D.   On: 11/20/2019 13:35   CT CORONARY FRACTIONAL FLOW RESERVE FLUID ANALYSIS  Result Date: 11/22/2019 EXAM: FFRCT ANALYSIS FINDINGS: FFRct analysis was performed on the original cardiac CT angiogram dataset. Diagrammatic  representation of the FFRct analysis is provided in a separate PDF document in PACS. This dictation was created using the PDF document and an interactive 3D model of the results. 3D model is not available in the EMR/PACS. Normal FFR range is >0.80. 1. Left Main: No significant stenosis. FFR = 1.00. 2. LAD: Significant flow limiting lesion in proximal LAD. Proximal FFR = 0.99, Mid FFR = 0.71, Distal FFR = 0.66. 3. LCX: No significant stenosis. Proximal FFR = 0.99, Mid FFR = 0.96, Distal FFR = 0.93. 4. Ramus: No significant stenosis. Proximal FFR = 0.99, Mid FFR = 0.97, Distal FFR = 0.91. 5. RCA: No significant stenosis. Proximal FFR = 0.96, Mid FFR = 0.95, Distal FFR = 0.92. IMPRESSION: 1. CT FFR flow analysis suggestive of significant flow limiting lesion in the proximal LAD (FFR = 0.69). 2.  Recommend cardiac catheterization for further evaluation. Armanda Magicraci Turner Electronically Signed   By: Armanda Magicraci  Turner   On: 11/22/2019 19:29   ECHOCARDIOGRAM COMPLETE  Result Date: 11/21/2019    ECHOCARDIOGRAM REPORT   Patient Name:    Carlos Valentine Date of Exam: 11/21/2019 Medical Rec #:  [id number]         Height:       66.0 in Accession #:    [id number]        Weight:       190.7 lb Date of Birth:  1956-04-30        BSA:          1.960 m Patient Age:    63 years          BP:           140/95 mmHg Patient Gender: M                 HR:           78 bpm. Exam Location:  Inpatient Procedure: 2D Echo Indications:    Abnormal ECG R94.31  History:        Patient has no prior history of Echocardiogram examinations.                 Signs/Symptoms:Syncope; Risk Factors:Hypertension, Dyslipidemia                 and Current Smoker.  Sonographer:    Thurman Coyerasey Kirkpatrick RDCS (AE) Referring Phys: [phone] LINDSAY B ROBERTS IMPRESSIONS  1. Left ventricular ejection fraction, by estimation, is 55 to 60%. The left ventricle has normal function. The left ventricle grossly normal regional wall motion. Left ventricular diastolic parameters are consistent with Grade I diastolic dysfunction (impaired relaxation).  2. Right ventricular systolic function is normal. The right ventricular size is normal. Tricuspid regurgitation signal is inadequate for assessing PA pressure.  3. The mitral valve is normal in structure. No evidence of mitral valve regurgitation. No evidence of mitral stenosis.  4. The aortic valve was not well visualized. Aortic valve regurgitation is trivial. No aortic stenosis is present.  5. Aortic dilatation noted. There is mild dilatation of the aortic root (41 mm) and of the ascending aorta measuring 43 mm.  6. The inferior vena cava is normal in size with greater than 50% respiratory variability, suggesting right atrial pressure of 3 mmHg. FINDINGS  Left Ventricle: Left ventricular ejection fraction, by estimation, is 55 to 60%. The left ventricle has normal function. The left ventricle has grossly normal regional wall motion. The left ventricular internal cavity size was normal in size. There is no left ventricular hypertrophy. Left  ventricular  diastolic parameters are consistent with Grade I diastolic dysfunction (impaired relaxation). Right Ventricle: The right ventricular size is normal. No increase in right ventricular wall thickness. Right ventricular systolic function is normal. Tricuspid regurgitation signal is inadequate for assessing PA pressure. Left Atrium: Left atrial size was normal in size. Right Atrium: Right atrial size was normal in size. Pericardium: There is no evidence of pericardial effusion. Mitral Valve: The mitral valve is normal in structure. Normal mobility of the mitral valve leaflets. No evidence of mitral valve regurgitation. No evidence of mitral valve stenosis. Tricuspid Valve: The tricuspid valve is normal in structure. Tricuspid valve regurgitation is not demonstrated. No evidence of tricuspid stenosis. Aortic Valve: The aortic valve was not well visualized. Aortic valve regurgitation is trivial. No aortic stenosis is present. Pulmonic Valve: The pulmonic valve was not well visualized. Pulmonic valve regurgitation is not visualized. No evidence of pulmonic stenosis. Aorta: Aortic dilatation noted. There is mild dilatation of the aortic root and of the ascending aorta measuring 43 mm. Venous: The inferior vena cava is normal in size with greater than 50% respiratory variability, suggesting right atrial pressure of 3 mmHg. IAS/Shunts: No atrial level shunt detected by color flow Doppler.  LEFT VENTRICLE PLAX 2D LVIDd:         4.00 cm  Diastology LVIDs:         2.70 cm  LV e' lateral:   8.16 cm/s LV PW:         0.95 cm  LV E/e' lateral: 7.5 LV IVS:        1.15 cm  LV e' medial:    7.62 cm/s LVOT diam:     2.15 cm  LV E/e' medial:  8.0 LV SV:         68 LV SV Index:   35 LVOT Area:     3.63 cm  RIGHT VENTRICLE RV S prime:     10.90 cm/s TAPSE (M-mode): 1.6 cm LEFT ATRIUM             Index       RIGHT ATRIUM           Index LA diam:        3.20 cm 1.63 cm/m  RA Area:     13.20 cm LA Vol (A2C):   50.9 ml 25.97 ml/m RA Volume:    25.30 ml  12.91 ml/m LA Vol (A4C):   27.5 ml 14.03 ml/m LA Biplane Vol: 37.8 ml 19.29 ml/m  AORTIC VALVE LVOT Vmax:   107.00 cm/s LVOT Vmean:  61.600 cm/s LVOT VTI:    0.188 m  AORTA Ao Root diam: 4.10 cm Ao Asc diam:  4.30 cm MITRAL VALVE MV Area (PHT): 2.39 cm    SHUNTS MV Decel Time: 317 msec    Systemic VTI:  0.19 m MV E velocity: 61.30 cm/s  Systemic Diam: 2.15 cm MV A velocity: 65.10 cm/s MV E/A ratio:  0.94 Weston Brass MD Electronically signed by Weston Brass MD Signature Date/Time: 11/21/2019/12:47:35 PM    Final    Disposition   Pt is being discharged home today in good condition.  Follow-up Plans & Appointments    Follow-up Information    Rosalio Macadamia, NP Follow up on 12/08/2019.   Specialties: Nurse Practitioner, Interventional Cardiology, Cardiology, Radiology Why: 12/08/19 at 0915am Contact information: [address] [place] [address] [phone]        Marinus Maw, MD Follow up on 12/10/2019.  Specialty: Cardiology Why: 12/10/19 at 915am Contact information: [address] [place] [address] [phone]          Discharge Instructions    Amb Referral to Cardiac Rehabilitation   Complete by: As directed    Will send Cardiac Phase 2 referral to [facility]   Diagnosis: Coronary Stents   After initial evaluation and assessments completed: Virtual Based Care may be provided alone or in conjunction with Phase 2 Cardiac Rehab based on patient barriers.: Yes     Discharge Medications   Allergies as of 11/25/2019   No Known Allergies     Medication List    STOP taking these medications   Dexilant 30 MG capsule Generic drug: Dexlansoprazole Replaced by: pantoprazole 40 MG tablet     TAKE these medications   acetaminophen 500 MG tablet Commonly known as: TYLENOL Take 500 mg by mouth every 6 (six) hours as needed for mild pain or headache.   amLODipine 5 MG tablet Commonly known as: NORVASC Take 1 tablet  (5 mg total) by mouth at bedtime. APPOINTMENT REQUIRED FOR REFILLS What changed: additional instructions   aspirin 81 MG tablet Take 81 mg by mouth in the morning.   atorvastatin 80 MG tablet Commonly known as: LIPITOR Take 1 tablet (80 mg total) by mouth daily. Start taking on: November 26, 2019 What changed:   medication strength  how much to take  additional instructions   irbesartan 75 MG tablet Commonly known as: AVAPRO TAKE 1 TABLET BY MOUTH IN THE MORNING What changed: when to take this   metoprolol succinate 50 MG 24 hr tablet Commonly known as: TOPROL-XL Take 1 tablet (50 mg total) by mouth daily. Take with or immediately following a meal. Start taking on: November 26, 2019   nitroGLYCERIN 0.4 MG SL tablet Commonly known as: NITROSTAT Place 1 tablet (0.4 mg total) under the tongue every 5 (five) minutes x 3 doses as needed for chest pain.   pantoprazole 40 MG tablet Commonly known as: PROTONIX Take 1 tablet (40 mg total) by mouth daily. Start taking on: November 26, 2019 Replaces: Dexilant 30 MG capsule   ticagrelor 90 MG Tabs tablet Commonly known as: BRILINTA Take 1 tablet (90 mg total) by mouth 2 (two) times daily.        Outstanding Labs/Studies   BMET   Duration of Discharge Encounter   Greater than 30 minutes including physician time.  Signed, Georgie Chard, NP 11/25/2019, 10:58 AM

## 2019-11-26 ENCOUNTER — Telehealth: Payer: Self-pay | Admitting: Nurse Practitioner

## 2019-11-26 NOTE — Telephone Encounter (Signed)
Patient calling stating he needs a note to go back to work. He states they need to know what he can and can't do. He says it can be faxed to his wife at: [phone]

## 2019-11-26 NOTE — Telephone Encounter (Signed)
What does his job entail from physical exertion standpoint

## 2019-11-26 NOTE — Telephone Encounter (Signed)
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 It requires that he stand in one position for extended periods of time. He also has to move loads at times.

## 2019-11-26 NOTE — Telephone Encounter (Signed)
I spoke with patient. He was discharged from the hospital yesterday. He states he was given no information regarding return to work date. He reports his employer is asking when he can return to work. Patient is scheduled to see Norma Fredrickson, NP on 12/08/19.  I told patient he should stay out of work until evaluated at follow up appointment and return to work date could be determined at that time.  Patient reports he has already been out of work for several days. He would like to return to work prior to appointment if possible or if not he is asking if he could be seen in office sooner thatn 4/5.  Patient reports he did have an episode of dizziness and heart beating fast earlier this AM.  He felt better after a few minutes and is currently feeling OK. I told patient I would send message to his cardiologist regarding possible return to work date and follow up appointment.

## 2019-11-26 NOTE — Telephone Encounter (Signed)
I would like him to stay out of work until see by Floyce Stakes

## 2019-11-26 NOTE — Telephone Encounter (Signed)
Left message to call office. Patient has post hospital appointment with Norma Fredrickson, NP on 12/08/19

## 2019-11-26 NOTE — Telephone Encounter (Signed)
No message needed °

## 2019-11-26 NOTE — Telephone Encounter (Signed)
Advised patient that he should remain out of work until he sees Norma Fredrickson, NP so that he can be evaluated. Patient verbalized understanding. I have faxed a letter to his work stating this.

## 2019-11-26 NOTE — Progress Notes (Addendum)
CARDIOLOGY OFFICE NOTE  Date:  12/02/2019    Carlos Valentine Date of Birth: 01-18-1956 Medical Record #[id number]  PCP:  Sunnie Nielsen, DO  Cardiologist:  Mayford Knife (NEW)  Chief Complaint  Patient presents with  . Follow-up    Seen for Dr. Mayford Knife (NEW)    History of Present Illness: Carlos Valentine is a 64 y.o. male who presents today for a post hospital visit. Seen for Dr. Mayford Knife (NEW)   He has a history of HTN, HLD, ongoing tobacco abuse - seen earlier this month in the ER with syncope, chest pain, dizziness and palpitations. Works as a Location manager. Had been at work - got upset - went to take a break and eat lunch - took 2 bites - then felt his heart racing and then down on the floor. Had chest pain after waking up. EMS was called. He refused to be transported by EMS. Wife subsequently brought him.  While at [facility] - had palpitations again - found to have SVT at 200 BMP. Converted spontaneously. Toprol was started. Felt that his syncope was related to decreased creebral perfusion from reduced CO during SVT. Cardiac CT showed calcium score 442 with CAD and abnormal FFR in the pLAD - s/p cath showing diffuse non obstructive CAD in the LCX and RCA and severe LAD stenosis treated with DES with IVUS guidance. There was a small diagonal side-branch occlusion post-PCI with significant associated chest pain and ST change. Vessel < 1.5 mm in diameter not suitable for PCI.  Recommendations were for aggressive medical therapy for secondary risk reduction and uninterrupted DAPT with ASA and Brilinta 90mg  BID for a minimum of 12 months. His statin was increased to Lipitor 80 mg daily.   "There was noted concern for dilatation of the aorta - his CT was to be re-looked at and measured to reevaluate measurements with CT. Cross sectional measurements to confirm".  The patient does not have symptoms concerning for COVID-19 infection (fever, chills, cough, or new shortness of  breath).   Comes in today. Here alone. He is doing well. He has had one spell of the fast heart beating - this happened the following day after discharge - otherwise he has done well. No chest pain otherwise. Not smoking. Taking his medicines. Wants to return to work and has a form - he works as a - needs to go back in order to pay his bills/stay on medicines, etc. He is not lightheaded or dizzy. He tells me he has had prior spells of "this fast heart beating" - he tells me he always has a warning - gets dizzy - he has not tried any vagal maneuvers - he prays instead. He is to see EP next week. Would like to return to work next week as well.   Past Medical History:  Diagnosis Date  . Hyperlipidemia   . Hypertension   . Obesity   . Tobacco use     Past Surgical History:  Procedure Laterality Date  . CORONARY STENT INTERVENTION N/A 11/24/2019   Procedure: CORONARY STENT INTERVENTION;  Surgeon: 11/26/2019, MD;  Location[facility] INVASIVE CV LAB;  Service: Cardiovascular;  Laterality: N/A;  . INTRAVASCULAR ULTRASOUND/IVUS N/A 11/24/2019   Procedure: Intravascular Ultrasound/IVUS;  Surgeon: 11/26/2019, MD;  Location: [facility] INVASIVE CV LAB;  Service: Cardiovascular;  Laterality: N/A;  . LEFT HEART CATH AND CORONARY ANGIOGRAPHY N/A 11/24/2019   Procedure: LEFT HEART CATH AND CORONARY ANGIOGRAPHY;  Surgeon: 11/26/2019,  MD;  Location: [facility] INVASIVE CV LAB;  Service: Cardiovascular;  Laterality: N/A;     Medications: Current Meds  Medication Sig  . acetaminophen (TYLENOL) 500 MG tablet Take 500 mg by mouth every 6 (six) hours as needed for mild pain or headache.  Marland Kitchen amLODipine (NORVASC) 5 MG tablet Take 1 tablet (5 mg total) by mouth at bedtime. APPOINTMENT REQUIRED FOR REFILLS  . aspirin 81 MG tablet Take 81 mg by mouth in the morning.   Marland Kitchen atorvastatin (LIPITOR) 80 MG tablet Take 1 tablet (80 mg total) by mouth daily.  . irbesartan (AVAPRO) 75 MG tablet TAKE 1 TABLET BY MOUTH  IN THE MORNING  . metoprolol succinate (TOPROL-XL) 50 MG 24 hr tablet Take 1 tablet (50 mg total) by mouth daily. Take with or immediately following a meal.  . nitroGLYCERIN (NITROSTAT) 0.4 MG SL tablet Place 1 tablet (0.4 mg total) under the tongue every 5 (five) minutes x 3 doses as needed for chest pain.  . pantoprazole (PROTONIX) 40 MG tablet Take 1 tablet (40 mg total) by mouth daily.  . ticagrelor (BRILINTA) 90 MG TABS tablet Take 1 tablet (90 mg total) by mouth 2 (two) times daily.     Allergies: No Known Allergies  Social History: The patient  reports that he has been smoking cigarettes. He has a 67.50 pack-year smoking history. He has never used smokeless tobacco. He reports previous drug use. He reports that he does not drink alcohol.   Family History: The patient's family history includes Hypertension in his mother.   Review of Systems: Please see the history of present illness.   All other systems are reviewed and negative.   Physical Exam: VS:  BP 128/76   Pulse 82   Ht 5\' 6"  (1.676 m)   Wt 195 lb 12 oz (88.8 kg)   SpO2 96%   BMI 31.59 kg/m  .  BMI Body mass index is 31.59 kg/m.  Wt Readings from Last 3 Encounters:  12/02/19 195 lb 12 oz (88.8 kg)  11/25/19 188 lb 12.8 oz (85.6 kg)  08/18/19 195 lb 1.9 oz (88.5 kg)    General: Pleasant. Alert and in no acute distress.   HEENT: Normal.  Neck: Supple, no JVD, carotid bruits, or masses noted.  Cardiac: Regular rate and rhythm. No murmurs, rubs, or gallops. No edema.  Respiratory:  Lungs are clear to auscultation bilaterally with normal work of breathing.  GI: Soft and nontender.  MS: No deformity or atrophy. Gait and ROM intact.  Skin: Warm and dry. Color is normal. He has some bruising.  Neuro:  Strength and sensation are intact and no gross focal deficits noted.  Psych: Alert, appropriate and with normal affect. Right wrist cath site looks fine.    LABORATORY DATA:  EKG:  EKG is not ordered today.     Lab Results  Component Value Date   WBC 18.4 (H) 11/25/2019   HGB 15.5 11/25/2019   HCT 45.7 11/25/2019   PLT 410 (H) 11/25/2019   GLUCOSE 121 (H) 11/25/2019   CHOL 118 11/21/2019   TRIG 43 11/21/2019   HDL 39 (L) 11/21/2019   LDLCALC 70 11/21/2019   ALT 13 07/15/2018   AST 16 07/15/2018   NA 137 11/25/2019   K 4.0 11/25/2019   CL 104 11/25/2019   CREATININE 0.84 11/25/2019   BUN 13 11/25/2019   CO2 25 11/25/2019   TSH 0.823 11/21/2019   PSA 1.6 11/11/2018   HGBA1C 6.4 (H) 11/21/2019  BNP (last 3 results) No results for input(s): BNP in the last 8760 hours.  ProBNP (last 3 results) No results for input(s): PROBNP in the last 8760 hours.   Other Studies Reviewed Today:  Echocardiogram 11/21/19:  1. Left ventricular ejection fraction, by estimation, is 55 to 60%. The  left ventricle has normal function. The left ventricle grossly normal  regional wall motion. Left ventricular diastolic parameters are consistent  with Grade I diastolic dysfunction  (impaired relaxation).  2. Right ventricular systolic function is normal. The right ventricular  size is normal. Tricuspid regurgitation signal is inadequate for assessing  PA pressure.  3. The mitral valve is normal in structure. No evidence of mitral valve  regurgitation. No evidence of mitral stenosis.  4. The aortic valve was not well visualized. Aortic valve regurgitation  is trivial. No aortic stenosis is present.  5. Aortic dilatation noted. There is mild dilatation of the aortic root  (41 mm) and of the ascending aorta measuring 43 mm.  6. The inferior vena cava is normal in size with greater than 50%  respiratory variability, suggesting right atrial pressure of 3 mmHg.  _____________   LHC 11/24/19:  1. Diffuse nonobstructive CAD in the left circumflex and RCA with associated ectasia in the RCA 2. Severe proximal LAD stenosis correlating with CT-FFR positive lesion, treated with a 4.0x15 mm  Resolute Onyx DES using IVUS guidance 3. Known normal LV systolic function by noninvasive assessment  Small diagonal side-branch occlusion post-PCI with significant associated chest pain and ST change. Vessel < 1.5 mm in diameter not suitable for PCI.  Recommend aggressive medical therapy for secondary risk reduction.   Assessment/Plan:  1. CAD - s/p PCI to LAD - negative troponins - he is doing well - on DAPT - has stopped smoking - needs aggressive risk factor modification. Ok to return to work on Monday December 08, 2019. No restrictions.   2. Residual CAD - to manage medically with aggressive risk factor modification.   3. SVT - now on beta blocker - to see EP next month - has only had one spell since discharge - noted to be much shorter in duration.   4. Syncope - felt to be due to SVT and decreased cerebral perfusion in the setting of SVT at 200 bpm. He has had documented SVT - he has EP consult next month. ?consider ablative therapy.   5. Tobacco abuse - not currently smoking - hopefully will be able to abstain permanently.   6. HTN - BP looks good on his current therapy. No changes made today.   7. HLD - now on high dose statin therapy - needs fasting labs on return.   8. DM - per PCP  9. Dilated aorta - was to have his CT re-looked at for measurement confirmation - I do not see that this was done - will ask Marchia Bond and Dr. Radford Pax to look at.  Addendum:  Sueanne Margarita, MD sent to Burtis Junes, NP  I reviewed CT and aortic root and ascending aorta are normal in size with maximum diameter 3.3cm.  Traci    10. Lung nodule - noted on the over read - he has had tobacco abuse - would favor repeat CT in one year.   70. COVID-19 Education: The signs and symptoms of COVID-19 were discussed with the patient and how to seek care for testing (follow up with PCP or arrange E-visit).  The importance of social distancing, staying at home, hand  hygiene and wearing a mask when  out in public were discussed today.  Current medicines are reviewed with the patient today.  The patient does not have concerns regarding medicines other than what has been noted above.  The following changes have been made:  See above.  Labs/ tests ordered today include:    Orders Placed This Encounter  Procedures  . Basic metabolic panel  . CBC     Disposition:   FU with Dr. Ladona Ridgel next week as planned. See Dr. Mayford Knife in about 6 weeks with fasting labs.    Patient is agreeable to this plan and will call if any problems develop in the interim.   SignedNorma Fredrickson, NP  12/02/2019 2:38 PM  [facility] [address] [place], [place]  [address] Phone: [phone] Fax: [phone]

## 2019-11-27 NOTE — Telephone Encounter (Signed)
Called patient back - his work did not receive fax. I have resent fax over.

## 2019-11-27 NOTE — Telephone Encounter (Signed)
Follow up  Pt is calling back, he said that his employer hasn't receive work note, he ask if we can send it again at fax # [phone] and also if we can send him a copy at [phone].  Please advise

## 2019-12-02 ENCOUNTER — Encounter: Payer: Self-pay | Admitting: Nurse Practitioner

## 2019-12-02 ENCOUNTER — Other Ambulatory Visit: Payer: Self-pay

## 2019-12-02 ENCOUNTER — Ambulatory Visit (INDEPENDENT_AMBULATORY_CARE_PROVIDER_SITE_OTHER): Payer: 59 | Admitting: Nurse Practitioner

## 2019-12-02 VITALS — BP 128/76 | HR 82 | Ht 66.0 in | Wt 195.8 lb

## 2019-12-02 DIAGNOSIS — Z955 Presence of coronary angioplasty implant and graft: Secondary | ICD-10-CM

## 2019-12-02 DIAGNOSIS — I471 Supraventricular tachycardia: Secondary | ICD-10-CM

## 2019-12-02 DIAGNOSIS — I259 Chronic ischemic heart disease, unspecified: Secondary | ICD-10-CM | POA: Diagnosis not present

## 2019-12-02 DIAGNOSIS — E782 Mixed hyperlipidemia: Secondary | ICD-10-CM

## 2019-12-02 DIAGNOSIS — I77810 Thoracic aortic ectasia: Secondary | ICD-10-CM

## 2019-12-02 DIAGNOSIS — I10 Essential (primary) hypertension: Secondary | ICD-10-CM

## 2019-12-02 NOTE — Patient Instructions (Addendum)
After Visit Summary:  We will be checking the following labs today - BMET & CBC   Medication Instructions:    Continue with your current medicines.    If you need a refill on your cardiac medications before your next appointment, please call your pharmacy.   Do Not Run Out of Your Medicines!!!!    Testing/Procedures To Be Arranged:  N/A  Follow-Up:   See Dr. Ladona Ridgel as planned next month  See Dr. Mayford Knife or me in about 6 weeks with fasting labs.     At [facility], you and your health needs are our priority.  As part of our continuing mission to provide you with exceptional heart care, we have created designated Provider Care Teams.  These Care Teams include your primary Cardiologist (physician) and Advanced Practice Providers (APPs -  Physician Assistants and Nurse Practitioners) who all work together to provide you with the care you need, when you need it.  Special Instructions:  . Stay safe, stay home, wash your hands for at least 20 seconds and wear a mask when out in public.  . It was good to talk with you today.  Peri Jefferson work on the not smoking. Rip Harbour to return back to work on Monday April 5th.    Call the [facility] office at [phone] if you have any questions, problems or concerns.

## 2019-12-03 LAB — BASIC METABOLIC PANEL
BUN/Creatinine Ratio: 20 (ref 10–24)
BUN: 16 mg/dL (ref 8–27)
CO2: 22 mmol/L (ref 20–29)
Calcium: 10.3 mg/dL — ABNORMAL HIGH (ref 8.6–10.2)
Chloride: 100 mmol/L (ref 96–106)
Creatinine, Ser: 0.81 mg/dL (ref 0.76–1.27)
GFR calc Af Amer: 110 mL/min/{1.73_m2} (ref >59)
GFR calc non Af Amer: 95 mL/min/{1.73_m2} (ref >59)
Glucose: 94 mg/dL (ref 65–99)
Potassium: 4.3 mmol/L (ref 3.5–5.2)
Sodium: 138 mmol/L (ref 134–144)

## 2019-12-03 LAB — CBC
Hematocrit: 43.6 % (ref 37.5–51.0)
Hemoglobin: 14.9 g/dL (ref 13.0–17.7)
MCH: 30.4 pg (ref 26.6–33.0)
MCHC: 34.2 g/dL (ref 31.5–35.7)
MCV: 89 fL (ref 79–97)
Platelets: 488 10*3/uL — ABNORMAL HIGH (ref 150–450)
RBC: 4.9 x10E6/uL (ref 4.14–5.80)
RDW: 12.5 % (ref 11.6–15.4)
WBC: 16 10*3/uL — ABNORMAL HIGH (ref 3.4–10.8)

## 2019-12-08 ENCOUNTER — Ambulatory Visit: Payer: 59 | Admitting: Nurse Practitioner

## 2019-12-10 ENCOUNTER — Other Ambulatory Visit: Payer: Self-pay

## 2019-12-10 ENCOUNTER — Encounter: Payer: Self-pay | Admitting: Internal Medicine

## 2019-12-10 ENCOUNTER — Ambulatory Visit (INDEPENDENT_AMBULATORY_CARE_PROVIDER_SITE_OTHER): Payer: 59 | Admitting: Internal Medicine

## 2019-12-10 VITALS — BP 120/72 | HR 86 | Ht 66.0 in | Wt 192.0 lb

## 2019-12-10 DIAGNOSIS — R55 Syncope and collapse: Secondary | ICD-10-CM

## 2019-12-10 DIAGNOSIS — I471 Supraventricular tachycardia: Secondary | ICD-10-CM

## 2019-12-10 NOTE — Patient Instructions (Signed)

## 2019-12-10 NOTE — Progress Notes (Signed)
HPI [name] is referred today for ongoing evaluation and management of SVT. He has CAD and underwent PCI/stenting by Dr. Burt Knack after presenting with SVT at 200/min. He has had symptoms of SVT for over3-4 months. He denies chest pain since his PCI but in SVT has had chest pain. He was started on a beta blocker which appears to be controlling his symptoms. No syncope.  No Known Allergies   Current Outpatient Medications  Medication Sig Dispense Refill  . acetaminophen (TYLENOL) 500 MG tablet Take 500 mg by mouth every 6 (six) hours as needed for mild pain or headache.    Marland Kitchen amLODipine (NORVASC) 5 MG tablet Take 1 tablet (5 mg total) by mouth at bedtime. APPOINTMENT REQUIRED FOR REFILLS 90 tablet 0  . aspirin 81 MG tablet Take 81 mg by mouth in the morning.     Marland Kitchen atorvastatin (LIPITOR) 80 MG tablet Take 1 tablet (80 mg total) by mouth daily. 90 tablet 3  . irbesartan (AVAPRO) 75 MG tablet TAKE 1 TABLET BY MOUTH IN THE MORNING 90 tablet 0  . metoprolol succinate (TOPROL-XL) 50 MG 24 hr tablet Take 1 tablet (50 mg total) by mouth daily. Take with or immediately following a meal. 90 tablet 3  . nitroGLYCERIN (NITROSTAT) 0.4 MG SL tablet Place 1 tablet (0.4 mg total) under the tongue every 5 (five) minutes x 3 doses as needed for chest pain. 25 tablet 0  . pantoprazole (PROTONIX) 40 MG tablet Take 1 tablet (40 mg total) by mouth daily. 90 tablet 3  . ticagrelor (BRILINTA) 90 MG TABS tablet Take 1 tablet (90 mg total) by mouth 2 (two) times daily. 90 tablet 3   No current facility-administered medications for this visit.     Past Medical History:  Diagnosis Date  . Hyperlipidemia   . Hypertension   . Obesity   . Tobacco use     ROS:   All systems reviewed and negative except as noted in the HPI.   Past Surgical History:  Procedure Laterality Date  . CORONARY STENT INTERVENTION N/A 11/24/2019   Procedure: CORONARY STENT INTERVENTION;  Surgeon: Sherren Mocha, MD;   Location: [place] CV LAB;  Service: Cardiovascular;  Laterality: N/A;  . INTRAVASCULAR ULTRASOUND/IVUS N/A 11/24/2019   Procedure: Intravascular Ultrasound/IVUS;  Surgeon: Sherren Mocha, MD;  Location: [place] [facility];  Service: Cardiovascular;  Laterality: N/A;  . LEFT HEART CATH AND CORONARY ANGIOGRAPHY N/A 11/24/2019   Procedure: LEFT HEART CATH AND CORONARY ANGIOGRAPHY;  Surgeon: Sherren Mocha, MD;  Location: [place] [facility];  Service: Cardiovascular;  Laterality: N/A;     Family History  Problem Relation Age of Onset  . Hypertension Mother      Social History   Socioeconomic History  . Marital status: Legally Separated    Spouse name: Not on file  . Number of children: Not on file  . Years of education: Not on file  . Highest education level: Not on file  Occupational History  . Not on file  Tobacco Use  . Smoking status: Current Every Day Smoker    Packs/day: 1.50    Years: 45.00    Pack years: 67.50    Types: Cigarettes  . Smokeless tobacco: Never Used  . Tobacco comment: as of 07/15/18 0.5 ppd  Substance and Sexual Activity  . Alcohol use: Never    Alcohol/week: 0.0 standard drinks  . Drug use: Not Currently  . Sexual activity: Yes  Other Topics Concern  .  Not on file  Social History Narrative  . Not on file   Social Determinants of Health   Financial Resource Strain:   . Difficulty of Paying Living Expenses:   Food Insecurity:   . Worried About Programme researcher, broadcasting/film/video in the Last Year:   . Barista in the Last Year:   Transportation Needs:   . Freight forwarder (Medical):   Marland Kitchen Lack of Transportation (Non-Medical):   Physical Activity:   . Days of Exercise per Week:   . Minutes of Exercise per Session:   Stress:   . Feeling of Stress :   Social Connections:   . Frequency of Communication with Friends and Family:   . Frequency of Social Gatherings with Friends and Family:   . Attends Religious Services:   . Active Member of Clubs  or Organizations:   . Attends Banker Meetings:   Marland Kitchen Marital Status:   Intimate Partner Violence:   . Fear of Current or Ex-Partner:   . Emotionally Abused:   Marland Kitchen Physically Abused:   . Sexually Abused:      BP 120/72   Pulse 86   Ht 5\' 6"  (1.676 m)   Wt 192 lb (87.1 kg)   SpO2 97%   BMI 30.99 kg/m   Physical Exam:  Well appearing NAD HEENT: Unremarkable Neck:  No JVD, no thyromegally Lymphatics:  No adenopathy Back:  No CVA tenderness Lungs:  Clear with no wheezes HEART:  Regular rate rhythm, no murmurs, no rubs, no clicks Abd:  soft, positive bowel sounds, no organomegally, no rebound, no guarding Ext:  2 plus pulses, no edema, no cyanosis, no clubbing Skin:  No rashes no nodules Neuro:  CN II through XII intact, motor grossly intact   Assess/Plan: 1. SVT - I have discussed the treatment options in detail. The patient is much better since starting the metoprolol. He will undergo watchful waiting. If his SVT returns then we will plan catheter ablation. 2. CAD - he is s/p PCI of the LAD and doing well.  3. Tobacco abuse - he is in remission. Hopefully he will not start back smoking.  .D

## 2019-12-10 NOTE — Addendum Note (Signed)
Addended by: Marinus Maw on: 12/10/2019 10:35 AM   Modules accepted: Level of Service

## 2019-12-18 ENCOUNTER — Telehealth: Payer: Self-pay | Admitting: Cardiology

## 2019-12-18 ENCOUNTER — Other Ambulatory Visit: Payer: Self-pay

## 2019-12-18 MED ORDER — ATORVASTATIN CALCIUM 80 MG PO TABS: 80.0000 mg | ORAL_TABLET | Freq: Every day | ORAL | 3 refills | Status: DC

## 2019-12-18 NOTE — Telephone Encounter (Signed)
New message   [facility] states that this patient's medication list needs to be sent up for new prescriptions. Please advise.

## 2019-12-18 NOTE — Telephone Encounter (Signed)
Attempted to call patient to discuss medications. Left voicemail for him to call back.

## 2019-12-18 NOTE — Telephone Encounter (Signed)
Spoke with the pharmacist at [facility] who states that the patient came into with a medication list stating that he needed them filled. The only refill that was sent to them was atorvastatin 80 mg. The pharmacist states that they last refilled atorvastatin at 20 mg so she wanted to confirm this dose was increased. I was able to verify that it was increased to 80 mg daily when he was discharged from the hospital on 11/25/19.

## 2019-12-23 ENCOUNTER — Telehealth: Payer: Self-pay

## 2019-12-23 ENCOUNTER — Telehealth: Payer: Self-pay | Admitting: Cardiology

## 2019-12-23 ENCOUNTER — Other Ambulatory Visit: Payer: Self-pay

## 2019-12-23 DIAGNOSIS — I10 Essential (primary) hypertension: Secondary | ICD-10-CM

## 2019-12-23 MED ORDER — PANTOPRAZOLE SODIUM 40 MG PO TBEC: 40.0000 mg | DELAYED_RELEASE_TABLET | Freq: Every day | ORAL | 3 refills | Status: DC

## 2019-12-23 MED ORDER — METOPROLOL SUCCINATE ER 50 MG PO TB24: 50.0000 mg | ORAL_TABLET | Freq: Every day | ORAL | 3 refills | Status: DC

## 2019-12-23 MED ORDER — IRBESARTAN 75 MG PO TABS: 75.0000 mg | ORAL_TABLET | Freq: Every morning | ORAL | 0 refills | Status: DC

## 2019-12-23 MED ORDER — TICAGRELOR 90 MG PO TABS: 90.0000 mg | ORAL_TABLET | Freq: Two times a day (BID) | ORAL | 3 refills | Status: DC

## 2019-12-23 MED ORDER — NITROGLYCERIN 0.4 MG SL SUBL: 0.4000 mg | SUBLINGUAL_TABLET | SUBLINGUAL | 6 refills | Status: AC | PRN

## 2019-12-23 NOTE — Telephone Encounter (Signed)
Pt's medications were sent to pt's pharmacy as requested. Confirmation received.  

## 2019-12-23 NOTE — Telephone Encounter (Signed)
°*  STAT* If patient is at the pharmacy, call can be transferred to refill team.   1. Which medications need to be refilled? (please list name of each medication and dose if known)  nitroGLYCERIN (NITROSTAT) 0.4 MG SL tablet metoprolol succinate (TOPROL-XL) 50 MG 24 hr tablet ticagrelor (BRILINTA) 90 MG TABS tablet pantoprazole (PROTONIX) 40 MG tablet  2. Which pharmacy/location (including street and city if local pharmacy) is medication to be sent to? [facility] - [place], [place] - [address]  3. Do they need a 30 day or 90 day supply? 90 day  Patient is out of medication.

## 2019-12-23 NOTE — Telephone Encounter (Signed)
**Note De-Identified Nehal Shives Obfuscation** I did a Brilinta PA through covermymeds and received the following message: Aurel Nguyen Key: [id number] - PA Case ID: [id number]  Outcome  This medication or product is on your plan's list of covered drugs. Prior authorization is not required at this time.  If your pharmacy has questions regarding the processing of your prescription, please have them call the [facility] pharmacy help desk at [phone]. **Please note: Formulary lowering, tiering exception, cost reduction and/or pre-benefit determination review (including prospective Medicare hospice reviews) requests cannot be requested using this method of submission. Please contact us at [phone] instead.  DrugBrilinta 90MG  tablets  Form[facility] Electronic Prior Authorization Form (2017 NCPDP)  Original Claim [id number]   I did call [facility] and made them aware. They ran the RX at #60/30 day supply and it went through. Was sent in for wrong quantity and day supply at #90/45 day supply which is what triggered this PA request. The pharmacist thanked me for calling them the discuss.

## 2020-01-08 ENCOUNTER — Telehealth: Payer: Self-pay

## 2020-01-08 NOTE — Telephone Encounter (Signed)
Left message for patient to call back in regards to his appointment scheduled with Dr. Mayford Knife on 05/13. Appointment will need to be rescheduled.

## 2020-01-15 ENCOUNTER — Ambulatory Visit: Payer: 59 | Admitting: Cardiology

## 2020-01-21 ENCOUNTER — Ambulatory Visit: Payer: 59 | Admitting: Cardiology

## 2020-02-03 ENCOUNTER — Ambulatory Visit (INDEPENDENT_AMBULATORY_CARE_PROVIDER_SITE_OTHER): Payer: 59 | Admitting: Cardiology

## 2020-02-03 ENCOUNTER — Encounter: Payer: Self-pay | Admitting: Cardiology

## 2020-02-03 ENCOUNTER — Other Ambulatory Visit: Payer: Self-pay

## 2020-02-03 VITALS — BP 118/78 | HR 86 | Ht 66.0 in | Wt 201.2 lb

## 2020-02-03 DIAGNOSIS — I471 Supraventricular tachycardia: Secondary | ICD-10-CM

## 2020-02-03 DIAGNOSIS — Z9861 Coronary angioplasty status: Secondary | ICD-10-CM

## 2020-02-03 DIAGNOSIS — R55 Syncope and collapse: Secondary | ICD-10-CM | POA: Diagnosis not present

## 2020-02-03 DIAGNOSIS — I2510 Atherosclerotic heart disease of native coronary artery without angina pectoris: Secondary | ICD-10-CM | POA: Diagnosis not present

## 2020-02-03 DIAGNOSIS — I10 Essential (primary) hypertension: Secondary | ICD-10-CM | POA: Diagnosis not present

## 2020-02-03 DIAGNOSIS — E782 Mixed hyperlipidemia: Secondary | ICD-10-CM

## 2020-02-03 NOTE — Progress Notes (Signed)
CARDIOLOGY OFFICE NOTE  Date:  02/03/2020    Carlos Valentine Date of Birth: [birth year] Medical Record #[id number]  PCP:  Sunnie Nielsen, DO  Cardiologist:  Mayford Knife   Chief Complaint  Patient presents with  . Coronary Artery Disease  . Hypertension  . Hyperlipidemia    History of Present Illness: Carlos Valentine is a 64 y.o. male with a history of HTN, HLD, ongoing tobacco abuse.  Hospitalized earlier this year with syncope, chest pain, dizziness and palpitations. Works as a Location manager. Had been at work - got upset - went to take a break and eat lunch - took 2 bites - then felt his heart racing and then down on the floor. Had chest pain after waking up. EMS was called. He refused to be transported by EMS. Wife subsequently brought him.  While at [facility] - had palpitations again - found to have SVT at 200 BMP. Converted spontaneously. Toprol was started. Felt that his syncope was related to decreased creebral perfusion from reduced CO during SVT. Cardiac CT showed calcium score 442 with CAD and abnormal FFR in the pLAD - s/p cath showing diffuse non obstructive CAD in the LCX and RCA and severe LAD stenosis treated with DES with IVUS guidance. There was a small diagonal side-branch occlusion post-PCI with significant associated chest pain and ST change. Vessel < 1.5 mm in diameter not suitable for PCI.  Recommendations were for aggressive medical therapy for secondary risk reduction and uninterrupted DAPT with ASA and Brilinta 90mg  BID for a minimum of 12 months. His statin was increased to Lipitor 80 mg daily.   He is here today for followup and is doing well.  He denies any chest pain or pressure, SOB, DOE, PND, orthopnea, LE edema, dizziness, palpitations or syncope. He is compliant with his meds and is tolerating meds with no SE.    Past Medical History:  Diagnosis Date  . Hyperlipidemia   . Hypertension   . Obesity   . Tobacco use     Past Surgical  History:  Procedure Laterality Date  . CORONARY STENT INTERVENTION N/A 11/24/2019   Procedure: CORONARY STENT INTERVENTION;  Surgeon: 11/26/2019, MD;  Location: [facility] INVASIVE CV LAB;  Service: Cardiovascular;  Laterality: N/A;  . INTRAVASCULAR ULTRASOUND/IVUS N/A 11/24/2019   Procedure: Intravascular Ultrasound/IVUS;  Surgeon: 11/26/2019, MD;  Location: [facility] INVASIVE CV LAB;  Service: Cardiovascular;  Laterality: N/A;  . LEFT HEART CATH AND CORONARY ANGIOGRAPHY N/A 11/24/2019   Procedure: LEFT HEART CATH AND CORONARY ANGIOGRAPHY;  Surgeon: 11/26/2019, MD;  Location: [facility] INVASIVE CV LAB;  Service: Cardiovascular;  Laterality: N/A;     Medications: Current Meds  Medication Sig  . acetaminophen (TYLENOL) 500 MG tablet Take 500 mg by mouth every 6 (six) hours as needed for mild pain or headache.  [facility] amLODipine (NORVASC) 5 MG tablet Take 1 tablet (5 mg total) by mouth at bedtime. APPOINTMENT REQUIRED FOR REFILLS  . aspirin 81 MG tablet Take 81 mg by mouth in the morning.   Marland Kitchen atorvastatin (LIPITOR) 80 MG tablet Take 1 tablet (80 mg total) by mouth daily.  . irbesartan (AVAPRO) 75 MG tablet Take 1 tablet (75 mg total) by mouth every morning.  . metoprolol succinate (TOPROL-XL) 50 MG 24 hr tablet Take 1 tablet (50 mg total) by mouth daily. Take with or immediately following a meal.  . nitroGLYCERIN (NITROSTAT) 0.4 MG SL tablet Place 1 tablet (0.4 mg total) under the tongue every  5 (five) minutes x 3 doses as needed for chest pain.  . pantoprazole (PROTONIX) 40 MG tablet Take 1 tablet (40 mg total) by mouth daily.  . ticagrelor (BRILINTA) 90 MG TABS tablet Take 1 tablet (90 mg total) by mouth 2 (two) times daily.     Allergies: No Known Allergies  Social History: The patient  reports that he quit smoking about 2 months ago. His smoking use included cigarettes. He has a 67.50 pack-year smoking history. He has never used smokeless tobacco. He reports previous drug use. He reports that he  does not drink alcohol.   Family History: The patient's family history includes Hypertension in his mother.   Review of Systems: Please see the history of present illness.   All other systems are reviewed and negative.   Physical Exam: VS:  BP 118/78   Pulse 86   Ht 5\' 6"  (1.676 m)   Wt 201 lb 3.2 oz (91.3 kg)   SpO2 94%   BMI 32.47 kg/m  .  BMI Body mass index is 32.47 kg/m.  Wt Readings from Last 3 Encounters:  02/03/20 201 lb 3.2 oz (91.3 kg)  12/10/19 192 lb (87.1 kg)  12/02/19 195 lb 12 oz (88.8 kg)    GEN: Well nourished, well developed in no acute distress HEENT: Normal NECK: No JVD; No carotid bruits LYMPHATICS: No lymphadenopathy CARDIAC:RRR, no murmurs, rubs, gallops RESPIRATORY:  Clear to auscultation without rales, wheezing or rhonchi  ABDOMEN: Soft, non-tender, non-distended MUSCULOSKELETAL:  No edema; No deformity  SKIN: Warm and dry NEUROLOGIC:  Alert and oriented x 3 PSYCHIATRIC:  Normal affect    LABORATORY DATA:  EKG:  EKG is not ordered today.    Lab Results  Component Value Date   WBC 16.0 (H) 12/02/2019   HGB 14.9 12/02/2019   HCT 43.6 12/02/2019   PLT 488 (H) 12/02/2019   GLUCOSE 94 12/02/2019   CHOL 118 11/21/2019   TRIG 43 11/21/2019   HDL 39 (L) 11/21/2019   LDLCALC 70 11/21/2019   ALT 13 07/15/2018   AST 16 07/15/2018   NA 138 12/02/2019   K 4.3 12/02/2019   CL 100 12/02/2019   CREATININE 0.81 12/02/2019   BUN 16 12/02/2019   CO2 22 12/02/2019   TSH 0.823 11/21/2019   PSA 1.6 11/11/2018   HGBA1C 6.4 (H) 11/21/2019     BNP (last 3 results) No results for input(s): BNP in the last 8760 hours.  ProBNP (last 3 results) No results for input(s): PROBNP in the last 8760 hours.   Other Studies Reviewed Today:  Echocardiogram 11/21/19:  1. Left ventricular ejection fraction, by estimation, is 55 to 60%. The  left ventricle has normal function. The left ventricle grossly normal  regional wall motion. Left ventricular  diastolic parameters are consistent  with Grade I diastolic dysfunction  (impaired relaxation).  2. Right ventricular systolic function is normal. The right ventricular  size is normal. Tricuspid regurgitation signal is inadequate for assessing  PA pressure.  3. The mitral valve is normal in structure. No evidence of mitral valve  regurgitation. No evidence of mitral stenosis.  4. The aortic valve was not well visualized. Aortic valve regurgitation  is trivial. No aortic stenosis is present.  5. Aortic dilatation noted. There is mild dilatation of the aortic root  (41 mm) and of the ascending aorta measuring 43 mm.  6. The inferior vena cava is normal in size with greater than 50%  respiratory variability, suggesting right atrial pressure  of 3 mmHg.  _____________   LHC 11/24/19:  1. Diffuse nonobstructive CAD in the left circumflex and RCA with associated ectasia in the RCA 2. Severe proximal LAD stenosis correlating with CT-FFR positive lesion, treated with a 4.0x15 mm Resolute Onyx DES using IVUS guidance 3. Known normal LV systolic function by noninvasive assessment  Small diagonal side-branch occlusion post-PCI with significant associated chest pain and ST change. Vessel < 1.5 mm in diameter not suitable for PCI.  Recommend aggressive medical therapy for secondary risk reduction.   Assessment/Plan:  1. CAD  - s/p PCI to LAD -he has stopped smoking -denies any anginal symptoms -continue on ASA 81mg  daily, Brilinta 90mg  BID, statin and BB.  2. SVT  -denies any further palpitations -seen by EP and recommended continuing BB unless he has further SVT and if so then ablation -continue on BB  4. Syncope  -felt to be due to SVT and decreased cerebral perfusion in the setting of SVT at 200 bpm.  -no further dizzy spells or syncope  5. HTN  -Bp controlled -continue Toprol XL 50mg  daily and Irbesartan 75mg  daily  6. HLD  -LDL goal < 70 -LDL 70 in March  2021 -continue Atorvastatin 80mg  daily  Current medicines are reviewed with the patient today.  The patient does not have concerns regarding medicines other than what has been noted above.  The following changes have been made:  See above.  Labs/ tests ordered today include:    No orders of the defined types were placed in this encounter.   Signed: , MD  02/03/2020 4:36 PM  [facility] [address] [place], Armanda Magic  04/04/2020 Phone: [phone] Fax: [phone]

## 2020-02-03 NOTE — Patient Instructions (Addendum)
Medication Instructions:  Your physician recommends that you continue on your current medications as directed. Please refer to the Current Medication list given to you today.  *If you need a refill on your cardiac medications before your next appointment, please call your pharmacy*  Follow-Up: At [facility], you and your health needs are our priority.  As part of our continuing mission to provide you with exceptional heart care, we have created designated Provider Care Teams.  These Care Teams include your primary Cardiologist (physician) and Advanced Practice Providers (APPs -  Physician Assistants and Nurse Practitioners) who all work together to provide you with the care you need, when you need it.  We recommend signing up for the patient portal called "MyChart".  Sign up information is provided on this After Visit Summary.  MyChart is used to connect with patients for Virtual Visits (Telemedicine).  Patients are able to view lab/test results, encounter notes, upcoming appointments, etc.  Non-urgent messages can be sent to your provider as well.   To learn more about what you can do with MyChart, go to [email or web address].    Your next appointment:   6 month(s)  The format for your next appointment:   In Person  Provider:   Armanda Magic, MD    Heart-Healthy Eating Plan Many factors influence your heart (coronary) health, including eating and exercise habits. Coronary risk increases with abnormal blood fat (lipid) levels. Heart-healthy meal planning includes limiting unhealthy fats, increasing healthy fats, and making other diet and lifestyle changes.  What are tips for following this plan? Cooking Cook foods using methods other than frying. Baking, boiling, grilling, and broiling are all good options. Other ways to reduce fat include:  Removing the skin from poultry.  Removing all visible fats from meats.  Steaming vegetables in water or broth. Meal planning   At  meals, imagine dividing your plate into fourths: ? Fill one-half of your plate with vegetables and green salads. ? Fill one-fourth of your plate with whole grains. ? Fill one-fourth of your plate with lean protein foods.  Eat 4-5 servings of vegetables per day. One serving equals 1 cup raw or cooked vegetable, or 2 cups raw leafy greens.  Eat 4-5 servings of fruit per day. One serving equals 1 medium whole fruit,  cup dried fruit,  cup fresh, frozen, or canned fruit, or  cup 100% fruit juice.  Eat more foods that contain soluble fiber. Examples include apples, broccoli, carrots, beans, peas, and barley. Aim to get 25-30 g of fiber per day.  Increase your consumption of legumes, nuts, and seeds to 4-5 servings per week. One serving of dried beans or legumes equals  cup cooked, 1 serving of nuts is  cup, and 1 serving of seeds equals 1 tablespoon. Fats  Choose healthy fats more often. Choose monounsaturated and polyunsaturated fats, such as olive and canola oils, flaxseeds, walnuts, almonds, and seeds.  Eat more omega-3 fats. Choose salmon, mackerel, sardines, tuna, flaxseed oil, and ground flaxseeds. Aim to eat fish at least 2 times each week.  Check food labels carefully to identify foods with trans fats or high amounts of saturated fat.  Limit saturated fats. These are found in animal products, such as meats, butter, and cream. Plant sources of saturated fats include palm oil, palm kernel oil, and coconut oil.  Avoid foods with partially hydrogenated oils in them. These contain trans fats. Examples are stick margarine, some tub margarines, cookies, crackers, and other baked goods.  Avoid fried foods.  General information  Eat more home-cooked food and less restaurant, buffet, and fast food.  Limit or avoid alcohol.  Limit foods that are high in starch and sugar.  Lose weight if you are overweight. Losing just 5-10% of your body weight can help your overall health and prevent  diseases such as diabetes and heart disease.  Monitor your salt (sodium) intake, especially if you have high blood pressure. Talk with your health care provider about your sodium intake.  Try to incorporate more vegetarian meals weekly. What foods can I eat? Fruits All fresh, canned (in natural juice), or frozen fruits. Vegetables Fresh or frozen vegetables (raw, steamed, roasted, or grilled). Green salads. Grains Most grains. Choose whole wheat and whole grains most of the time. Rice and pasta, including brown rice and pastas made with whole wheat. Meats and other proteins Lean, well-trimmed beef, veal, pork, and lamb. Chicken and [place] without skin. All fish and shellfish. Wild duck, rabbit, pheasant, and venison. Egg whites or low-cholesterol egg substitutes. Dried beans, peas, lentils, and tofu. Seeds and most nuts. Dairy Low-fat or nonfat cheeses, including ricotta and mozzarella. Skim or 1% milk (liquid, powdered, or evaporated). Buttermilk made with low-fat milk. Nonfat or low-fat yogurt. Fats and oils Non-hydrogenated (trans-free) margarines. Vegetable oils, including soybean, sesame, sunflower, olive, peanut, safflower, corn, canola, and cottonseed. Salad dressings or mayonnaise made with a vegetable oil. Beverages Water (mineral or sparkling). Coffee and tea. Diet carbonated beverages. Sweets and desserts Sherbet, gelatin, and fruit ice. Small amounts of dark chocolate. Limit all sweets and desserts. Seasonings and condiments All seasonings and condiments. The items listed above may not be a complete list of foods and beverages you can eat. Contact a dietitian for more options. What foods are not recommended? Fruits Canned fruit in heavy syrup. Fruit in cream or butter sauce. Fried fruit. Limit coconut. Vegetables Vegetables cooked in cheese, cream, or butter sauce. Fried vegetables. Grains Breads made with saturated or trans fats, oils, or whole milk. Croissants. Sweet  rolls. Donuts. High-fat crackers, such as cheese crackers. Meats and other proteins Fatty meats, such as hot dogs, ribs, sausage, bacon, rib-eye roast or steak. High-fat deli meats, such as salami and bologna. Caviar. Domestic duck and goose. Organ meats, such as liver. Dairy Cream, sour cream, cream cheese, and creamed cottage cheese. Whole milk cheeses. Whole or 2% milk (liquid, evaporated, or condensed). Whole buttermilk. Cream sauce or high-fat cheese sauce. Whole-milk yogurt. Fats and oils Meat fat, or shortening. Cocoa butter, hydrogenated oils, palm oil, coconut oil, palm kernel oil. Solid fats and shortenings, including bacon fat, salt pork, lard, and butter. Nondairy cream substitutes. Salad dressings with cheese or sour cream. Beverages Regular sodas and any drinks with added sugar. Sweets and desserts Frosting. Pudding. Cookies. Cakes. Pies. Milk chocolate or white chocolate. Buttered syrups. Full-fat ice cream or ice cream drinks. The items listed above may not be a complete list of foods and beverages to avoid. Contact a dietitian for more information. Summary  Heart-healthy meal planning includes limiting unhealthy fats, increasing healthy fats, and making other diet and lifestyle changes.  Lose weight if you are overweight. Losing just 5-10% of your body weight can help your overall health and prevent diseases such as diabetes and heart disease.  Focus on eating a balance of foods, including fruits and vegetables, low-fat or nonfat dairy, lean protein, nuts and legumes, whole grains, and heart-healthy oils and fats. This information is not intended to replace advice given to you by your health care  provider. Make sure you discuss any questions you have with your health care provider. Document Revised: 09/28/2017 Document Reviewed: 09/28/2017 Elsevier Patient Education  2020 [employer].

## 2020-04-07 ENCOUNTER — Telehealth: Payer: Self-pay

## 2020-04-07 NOTE — Telephone Encounter (Signed)
Returned call to patient after two  messages left on voicemail. Unable to speak with speak. Left message in voicemail to return call to the office. Patient may be calling to report his positive covid 19 results with clinic.

## 2020-04-08 ENCOUNTER — Encounter: Payer: Self-pay | Admitting: Medical-Surgical

## 2020-04-08 ENCOUNTER — Telehealth (INDEPENDENT_AMBULATORY_CARE_PROVIDER_SITE_OTHER): Payer: 59 | Admitting: Medical-Surgical

## 2020-04-08 VITALS — BP 120/91 | HR 90 | Temp 96.2°F | Wt 195.0 lb

## 2020-04-08 DIAGNOSIS — U071 COVID-19: Secondary | ICD-10-CM | POA: Diagnosis not present

## 2020-04-08 NOTE — Progress Notes (Signed)
Virtual Visit via Video Note  I connected with Carlos Valentine on 04/08/20 at  8:10 AM EDT by a video enabled telemedicine application and verified that I am speaking with the correct person using two identifiers.   I discussed the limitations of evaluation and management by telemedicine and the availability of in person appointments. The patient expressed understanding and agreed to proceed.  Patient location: home Provider locations: office  Subjective:    CC: COVID positive  HPI: Pleasant 64 year old male presenting via MyChart video visit with reports testing Covid +2 days ago.  He is experiencing dizziness, head congestion, rhinorrhea, decreased appetite, headache, fatigue, sinus congestion.  Notes that what mucus he is able to expel is white.  Denies fever, chills, chest pain, shortness of breath, GI symptoms.  Has not been taking any medications to help with symptoms so far.  Please staying home and getting plenty of rest.  Aware of quarantine recommendations.  Notes that he does have someone who can go to the store or the pharmacy to get medications for him.  Past medical history, Surgical history, Family history not pertinant except as noted below, Social history, Allergies, and medications have been entered into the medical record, reviewed, and corrections made.   Review of Systems: See HPI for pertinent positives and negatives.   Objective:    General: Speaking clearly in complete sentences without any shortness of breath.  Alert and oriented x3.  Normal judgment. No apparent acute distress.  Impression and Recommendations:    1. COVID-19 virus infection Discussed treatment options for COVID-19 including symptomatic OTC treatments versus monoclonal antibody infusion.  Patient would like to defer antibiotic infusion.  Plans to stay home and treat symptomatically.  Reviewed medication recommendations over-the-counter for cold and flu preparations to help with his symptoms.   Increase fluid and rest.  Remain quarantined as instructed.  Reviewed emergency symptoms such as chest pain, shortness of breath, and high fever that does not respond to medication.  Advised patient to report to the emergency room should he develop any of those emergency symptoms.  Return if symptoms worsen or fail to improve.  20 minutes of non-face-to-face time was provided during this encounter.  I discussed the assessment and treatment plan with the patient. The patient was provided an opportunity to ask questions and all were answered. The patient agreed with the plan and demonstrated an understanding of the instructions.   The patient was advised to call back or seek an in-person evaluation if the symptoms worsen or if the condition fails to improve as anticipated.  Thayer Ohm, DNP, APRN, FNP-BC [facility]

## 2020-04-14 LAB — TSH

## 2020-04-14 LAB — CBC

## 2020-04-14 LAB — LIPID PANEL

## 2020-04-14 LAB — SARS-COV-2 RNA,(COVID-19) QUALITATIVE NAAT: SARS CoV2 RNA: UNDETERMINED — CR

## 2020-04-14 LAB — COMPLETE METABOLIC PANEL WITH GFR

## 2020-04-16 ENCOUNTER — Other Ambulatory Visit: Payer: Self-pay | Admitting: Osteopathic Medicine

## 2020-04-16 DIAGNOSIS — I10 Essential (primary) hypertension: Secondary | ICD-10-CM

## 2020-05-17 ENCOUNTER — Other Ambulatory Visit: Payer: Self-pay | Admitting: Osteopathic Medicine

## 2020-05-17 DIAGNOSIS — I10 Essential (primary) hypertension: Secondary | ICD-10-CM

## 2020-06-09 ENCOUNTER — Encounter: Payer: Self-pay | Admitting: Osteopathic Medicine

## 2020-06-09 ENCOUNTER — Ambulatory Visit (INDEPENDENT_AMBULATORY_CARE_PROVIDER_SITE_OTHER): Payer: 59 | Admitting: Osteopathic Medicine

## 2020-06-09 VITALS — BP 131/75 | HR 79 | Wt 208.0 lb

## 2020-06-09 DIAGNOSIS — F5101 Primary insomnia: Secondary | ICD-10-CM | POA: Diagnosis not present

## 2020-06-09 MED ORDER — BELSOMRA 15 MG PO TABS: 15.0000 mg | ORAL_TABLET | Freq: Every day | ORAL | 0 refills | Status: DC

## 2020-06-09 MED ORDER — BELSOMRA 10 MG PO TABS: 1.0000 | ORAL_TABLET | Freq: Every day | ORAL | 0 refills | Status: DC

## 2020-06-09 MED ORDER — BELSOMRA 20 MG PO TABS: 20.0000 mg | ORAL_TABLET | Freq: Every day | ORAL | 0 refills | Status: DC

## 2020-06-09 NOTE — Patient Instructions (Signed)

## 2020-06-09 NOTE — Progress Notes (Signed)
Carlos Valentine is a 64 y.o. male who presents to  [facility] at [facility]  today, 06/09/20, seeking care for the following:  Insomnia   ASSESSMENT & PLAN with other pertinent findings:  The encounter diagnosis was Primary insomnia.   Sleep maintenance an issue No snoring/apnea or daytime somnolence  Denies anxiey/depression  Trial Belsomra  Consider Doxepin or Trazodone   Patient Instructions  Insomnia Insomnia is a sleep disorder that makes it difficult to fall asleep or stay asleep. Insomnia can cause fatigue, low energy, difficulty concentrating, mood swings, and poor performance at work or school. There are three different ways to classify insomnia:  Difficulty falling asleep.  Difficulty staying asleep.  Waking up too early in the morning. Any type of insomnia can be long-term (chronic) or short-term (acute). Both are common. Short-term insomnia usually lasts for three months or less. Chronic insomnia occurs at least three times a week for longer than three months. What are the causes? Insomnia may be caused by another condition, situation, or substance, such as:  Anxiety.  Certain medicines.  Gastroesophageal reflux disease (GERD) or other gastrointestinal conditions.  Asthma or other breathing conditions.  Restless legs syndrome, sleep apnea, or other sleep disorders.  Chronic pain.  Menopause.  Stroke.  Abuse of alcohol, tobacco, or illegal drugs.  Mental health conditions, such as depression.  Caffeine.  Neurological disorders, such as Alzheimer's disease.  An overactive thyroid (hyperthyroidism). Sometimes, the cause of insomnia may not be known. What increases the risk? Risk factors for insomnia include:  Gender. Women are affected more often than men.  Age. Insomnia is more common as you get older.  Stress.  Lack of exercise.  Irregular work schedule or working night shifts.  Traveling  between different time zones.  Certain medical and mental health conditions. What are the signs or symptoms? If you have insomnia, the main symptom is having trouble falling asleep or having trouble staying asleep. This may lead to other symptoms, such as:  Feeling fatigued or having low energy.  Feeling nervous about going to sleep.  Not feeling rested in the morning.  Having trouble concentrating.  Feeling irritable, anxious, or depressed. How is this diagnosed? This condition may be diagnosed based on:  Your symptoms and medical history. Your health care provider may ask about: ? Your sleep habits. ? Any medical conditions you have. ? Your mental health.  A physical exam. How is this treated? Treatment for insomnia depends on the cause. Treatment may focus on treating an underlying condition that is causing insomnia. Treatment may also include:  Medicines to help you sleep.  Counseling or therapy.  Lifestyle adjustments to help you sleep better. Follow these instructions at home: Eating and drinking   Limit or avoid alcohol, caffeinated beverages, and cigarettes, especially close to bedtime. These can disrupt your sleep.  Do not eat a large meal or eat spicy foods right before bedtime. This can lead to digestive discomfort that can make it hard for you to sleep. Sleep habits   Keep a sleep diary to help you and your health care provider figure out what could be causing your insomnia. Write down: ? When you sleep. ? When you wake up during the night. ? How well you sleep. ? How rested you feel the next day. ? Any side effects of medicines you are taking. ? What you eat and drink.  Make your bedroom a dark, comfortable place where it is easy to fall  asleep. ? Put up shades or blackout curtains to block light from outside. ? Use a white noise machine to block noise. ? Keep the temperature cool.  Limit screen use before bedtime. This includes: ? Watching  TV. ? Using your smartphone, tablet, or computer.  Stick to a routine that includes going to bed and waking up at the same times every day and night. This can help you fall asleep faster. Consider making a quiet activity, such as reading, part of your nighttime routine.  Try to avoid taking naps during the day so that you sleep better at night.  Get out of bed if you are still awake after 15 minutes of trying to sleep. Keep the lights down, but try reading or doing a quiet activity. When you feel sleepy, go back to bed. General instructions  Take over-the-counter and prescription medicines only as told by your health care provider.  Exercise regularly, as told by your health care provider. Avoid exercise starting several hours before bedtime.  Use relaxation techniques to manage stress. Ask your health care provider to suggest some techniques that may work well for you. These may include: ? Breathing exercises. ? Routines to release muscle tension. ? Visualizing peaceful scenes.  Make sure that you drive carefully. Avoid driving if you feel very sleepy.  Keep all follow-up visits as told by your health care provider. This is important. Contact a health care provider if:  You are tired throughout the day.  You have trouble in your daily routine due to sleepiness.  You continue to have sleep problems, or your sleep problems get worse. Get help right away if:  You have serious thoughts about hurting yourself or someone else. If you ever feel like you may hurt yourself or others, or have thoughts about taking your own life, get help right away. You can go to your nearest emergency department or call:  Your local emergency services (911 in the [place]  A suicide crisis helpline, such as the National Suicide Prevention Lifeline at 479 842 1626. This is open 24 hours a day. Summary  Insomnia is a sleep disorder that makes it difficult to fall asleep or stay asleep.  Insomnia can be  long-term (chronic) or short-term (acute).  Treatment for insomnia depends on the cause. Treatment may focus on treating an underlying condition that is causing insomnia.  Keep a sleep diary to help you and your health care provider figure out what could be causing your insomnia. This information is not intended to replace advice given to you by your health care provider. Make sure you discuss any questions you have with your health care provider. Document Revised: 08/03/2017 Document Reviewed: 05/31/2017 Elsevier Patient Education  2020 [employer].    No orders of the defined types were placed in this encounter.   Meds ordered this encounter  Medications  . Suvorexant (BELSOMRA) 10 MG TABS    Sig: Take 1 tablet by mouth at bedtime.    Dispense:  30 tablet    Refill:  0  . Suvorexant (BELSOMRA) 15 MG TABS    Sig: Take 15 mg by mouth at bedtime.    Dispense:  10 tablet    Refill:  0  . Suvorexant (BELSOMRA) 20 MG TABS    Sig: Take 20 mg by mouth at bedtime.    Dispense:  10 tablet    Refill:  0       Follow-up instructions: Return if symptoms worsen or fail to improve.  BP 131/75 (BP Location: Left Arm, Patient Position: Sitting)   Pulse 79   Wt 208 lb (94.3 kg)   SpO2 97%   BMI 33.57 kg/m   Current Meds  Medication Sig  . acetaminophen (TYLENOL) 500 MG tablet Take 500 mg by mouth every 6 (six) hours as needed for mild pain or headache.  Marland Kitchen amLODipine (NORVASC) 5 MG tablet TAKE 1 TABLET BY MOUTH AT BEDTIME . APPOINTMENT REQUIRED FOR FUTURE REFILLS  . aspirin 81 MG tablet Take 81 mg by mouth in the morning.   Marland Kitchen atorvastatin (LIPITOR) 80 MG tablet Take 1 tablet (80 mg total) by mouth daily.  . irbesartan (AVAPRO) 75 MG tablet TAKE 1 TABLET BY MOUTH IN THE MORNING -  NO  REFILLS.  PATIENT  IS  DUE  FOR  BP  CHECK  . metoprolol succinate (TOPROL-XL) 50 MG 24 hr tablet Take 1 tablet (50 mg  total) by mouth daily. Take with or immediately following a meal.  . nitroGLYCERIN (NITROSTAT) 0.4 MG SL tablet Place 1 tablet (0.4 mg total) under the tongue every 5 (five) minutes x 3 doses as needed for chest pain.  . pantoprazole (PROTONIX) 40 MG tablet Take 1 tablet (40 mg total) by mouth daily.  . ticagrelor (BRILINTA) 90 MG TABS tablet Take 1 tablet (90 mg total) by mouth 2 (two) times daily.    No results found for this or any previous visit (from the past 72 hour(s)).  No results found.     All questions at time of visit were answered - patient instructed to contact office with any additional concerns or updates.  ER/RTC precautions were reviewed with the patient as applicable.   Please note: voice recognition software was used to produce this document, and typos may escape review. Please contact Dr. Lyn Hollingshead for any needed clarifications.

## 2020-06-14 ENCOUNTER — Telehealth: Payer: Self-pay | Admitting: Osteopathic Medicine

## 2020-06-14 NOTE — Telephone Encounter (Signed)
I prescribed the Belsomra less than a week ago, with instructions to take 10 mg for 10 days, 15 mg dose for 10 days if the 10 was not working, 20 mg dose for 10 days if the 10 or 15 was not working.  He certainly has not been on it long enough for [place] to know if it is going to work or not...  Please call patient back and confirm the following: Did he get the prescription for the 10, 15, or 20 mg doses, or was he not able to afford any of this?   If he did fill the prescription, please take it as directed as above, 10 mg for 10 days, 15 mg for 10 days, then 20 mg for 10 days If he did not fill the prescription, or it is too expensive, I can send an alternative

## 2020-06-14 NOTE — Telephone Encounter (Signed)
Patient stated the new medication prescribed was not working and it was also too expenses (did not know the name but the latest medication prescribed seems to be Belsomra). He was wondering if you could send in something else instead.

## 2020-06-18 NOTE — Telephone Encounter (Signed)
Patient came in very upset that he had not heard back from anyone. I spoke to Gates and she is reviewing the note and giving Woodruff a call.

## 2020-06-18 NOTE — Telephone Encounter (Signed)
Left a message advising of recommendations.  

## 2020-06-26 ENCOUNTER — Other Ambulatory Visit: Payer: Self-pay | Admitting: Cardiology

## 2020-06-29 ENCOUNTER — Telehealth: Payer: Self-pay | Admitting: Osteopathic Medicine

## 2020-06-29 MED ORDER — TRAZODONE HCL 50 MG PO TABS: 25.0000 mg | ORAL_TABLET | Freq: Every evening | ORAL | 3 refills | Status: DC | PRN

## 2020-06-29 NOTE — Addendum Note (Signed)
Addended by: Deirdre Pippins on: 06/29/2020 05:21 PM   Modules accepted: Orders

## 2020-06-29 NOTE — Telephone Encounter (Signed)
If Carlos Valentine is fine with switching since he has family that sees her, I don't have an issue, would Sherwood Gambler read recent notes and determine for herself but re: this medication problem I doubt this is a situation that Carlos Valentine would treat much differently.   I can send in Trazodone to help with sleep but he needs to follow up with me in 2 weeks with an appointment to discuss how this change is working. If he wants to establish with different PCP, then they will need to work with him on chronic insomnia treatment, no new Rx from me if he ends up seeing Carlos Valentine (if she approves) or if he changes practices.

## 2020-06-29 NOTE — Telephone Encounter (Signed)
Patient came in for the 3rd time again, he stated and wasn't too happy that he had to come in for the 3rd time to discuss this with us. °He stated that he did take 10 mg for 10 days and it did NOT help for those 10 days, and that his insurance did not pay for the medication so he did not continue the rest: 15 mg for 10 days due to price being so expensive, insurance not covering and the medication not working for 10 days that he was on it. °  °Patient is requesting an alternative medication that insurance covers and works for him. °  °Patient seemed super upset about having to come into office for this 3 different times, and is also requesting to change physician's to [facility], as that is his wife's physician, states due to all of these Medication Issues and having a hard time getting calls back regarding this. °Please Advise, AM °

## 2020-06-29 NOTE — Telephone Encounter (Signed)
Patient came in for the 3rd time again, he stated and wasn't too happy that he had to come in for the 3rd time to discuss this with [place]. He stated that he did take 10 mg for 10 days and it did NOT help for those 10 days, and that his insurance did not pay for the medication so he did not continue the rest: 15 mg for 10 days due to price being so expensive, insurance not covering and the medication not working for 10 days that he was on it.  Patient is requesting an alternative medication that insurance covers and works for him.  Patient seemed super upset about having to come into office for this 3 different times, and is also requesting to change physician's to [facility], as that is his wife's physician, states due to all of these Medication Issues and having a hard time getting calls back regarding this. Please Advise, AM

## 2020-06-29 NOTE — Telephone Encounter (Signed)
He states he has tried the increase of the medication. He is wanting a different prescription.

## 2020-06-29 NOTE — Telephone Encounter (Signed)
See other telephone message 

## 2020-06-30 NOTE — Telephone Encounter (Signed)
Since I see his wife I am ok with the switch. He does need to be aware that when we start new medications frequent follow up are needed to make sure medication is working as well as most medications do take time to work. If he started the trazodone then follow up for visit in 2 weeks.

## 2020-08-20 ENCOUNTER — Other Ambulatory Visit: Payer: Self-pay | Admitting: Osteopathic Medicine

## 2020-08-20 DIAGNOSIS — I10 Essential (primary) hypertension: Secondary | ICD-10-CM

## 2020-09-13 ENCOUNTER — Emergency Department (INDEPENDENT_AMBULATORY_CARE_PROVIDER_SITE_OTHER)
Admission: EM | Admit: 2020-09-13 | Discharge: 2020-09-13 | Disposition: A | Discharge status: Home / Self Care | Payer: 59 | Source: Home / Self Care

## 2020-09-13 ENCOUNTER — Other Ambulatory Visit: Payer: Self-pay

## 2020-09-13 ENCOUNTER — Ambulatory Visit: Payer: 59 | Admitting: Nurse Practitioner

## 2020-09-13 DIAGNOSIS — I4510 Unspecified right bundle-branch block: Secondary | ICD-10-CM

## 2020-09-13 DIAGNOSIS — R079 Chest pain, unspecified: Secondary | ICD-10-CM | POA: Diagnosis not present

## 2020-09-13 DIAGNOSIS — Z8679 Personal history of other diseases of the circulatory system: Secondary | ICD-10-CM

## 2020-09-13 NOTE — ED Notes (Signed)
Patient is being discharged from the Urgent Care and sent to the Emergency Department via POV. Patient refused EMS transport . Per Waylan Rocher, PA, patient is in need of higher level of care due to need for further evaluation of left sided chest pain. Patient is aware and verbalizes understanding of plan of care.  Vitals:   09/13/20 1623 09/13/20 1626  BP: (!) 173/103 (!) 158/99  Pulse: 95   Resp: 18   Temp: 98.6 F (37 C)   SpO2: 95%

## 2020-09-13 NOTE — Discharge Instructions (Signed)
  You have declined EMS transport. Please drive yourself directly to the emergency department for further evaluation and treatment of your symptoms.  The hospital has labs and resources not available at our urgent care.

## 2020-09-13 NOTE — ED Triage Notes (Signed)
Patient presents to Urgent Care with complaints of left sided chest pain radiating to his back, tight and squeezing since this morning. Patient reports he had a stent placed a few months ago.

## 2020-09-13 NOTE — ED Provider Notes (Signed)
[facility]    CSN: [id number] Arrival date & time: 09/13/20  1614      History   Chief Complaint Chief Complaint  Patient presents with  . Chest Pain    HPI Carlos Valentine is a 65 y.o. male.   HPI  Carlos Valentine is a 65 y.o. male presenting to UC with c/o Left side chest pain that started this morning around 3AM, described as a squeezing/tight sensation, radiating into back. Pains is 7/10, waxing and waning since onset. No medication taken PTA but states he is taking his blood thinner prescribed after having a stint placed in his heart March 2021.  Pt unsure of name of the medication. Denies SOB. Denies diaphoresis or nausea.     Past Medical History:  Diagnosis Date  . Hyperlipidemia   . Hypertension   . Obesity   . Tobacco use     Patient Active Problem List   Diagnosis Date Noted  . CAD S/P percutaneous coronary angioplasty 11/25/2019  . Palpitations 11/22/2019  . Syncope and collapse   . Essential hypertension   . Chest pain of uncertain etiology   . SVT (supraventricular tachycardia) (HCC) 11/20/2019  . Syncope 11/20/2019  . Peripheral edema 11/21/2018  . Prediabetes 11/11/2018  . Glucosuria 10/20/2018  . Nocturia 10/18/2018  . Encounter for screening for malignant neoplasm of respiratory organs 10/18/2018  . Smokers' cough (HCC) 07/21/2018  . Mixed hyperlipidemia 07/21/2018  . Hypertension goal BP (blood pressure) < 130/80 07/21/2018  . Encounter for tobacco use cessation counseling 09/22/2015  . Tobacco use 09/22/2015    Past Surgical History:  Procedure Laterality Date  . CORONARY STENT INTERVENTION N/A 11/24/2019   Procedure: CORONARY STENT INTERVENTION;  Surgeon: Tonny Bollman, MD;  Location: [facility] INVASIVE CV LAB;  Service: Cardiovascular;  Laterality: N/A;  . INTRAVASCULAR ULTRASOUND/IVUS N/A 11/24/2019   Procedure: Intravascular Ultrasound/IVUS;  Surgeon: Tonny Bollman, MD;  Location: [facility] INVASIVE CV LAB;  Service:  Cardiovascular;  Laterality: N/A;  . LEFT HEART CATH AND CORONARY ANGIOGRAPHY N/A 11/24/2019   Procedure: LEFT HEART CATH AND CORONARY ANGIOGRAPHY;  Surgeon: Tonny Bollman, MD;  Location: [facility] INVASIVE CV LAB;  Service: Cardiovascular;  Laterality: N/A;       Home Medications    Prior to Admission medications   Medication Sig Start Date End Date Taking? Authorizing Provider  aspirin 81 MG tablet Take 81 mg by mouth in the morning.    Yes [provider]  acetaminophen (TYLENOL) 500 MG tablet Take 500 mg by mouth every 6 (six) hours as needed for mild pain or headache.    [provider]  amLODipine (NORVASC) 5 MG tablet TAKE 1 TABLET BY MOUTH AT BEDTIME . APPOINTMENT REQUIRED FOR FUTURE REFILLS 04/16/20   Sunnie Nielsen, DO  atorvastatin (LIPITOR) 80 MG tablet Take 1 tablet (80 mg total) by mouth daily. 12/18/19   Marinus Maw, MD  irbesartan (AVAPRO) 75 MG tablet TAKE 1 TABLET BY MOUTH IN THE MORNING . APPOINTMENT REQUIRED FOR FUTURE REFILLS 08/20/20   Sunnie Nielsen, DO  metoprolol succinate (TOPROL-XL) 50 MG 24 hr tablet Take 1 tablet (50 mg total) by mouth daily. Take with or immediately following a meal. 12/23/19   Turner, Cornelious Bryant, MD  nitroGLYCERIN (NITROSTAT) 0.4 MG SL tablet Place 1 tablet (0.4 mg total) under the tongue every 5 (five) minutes x 3 doses as needed for chest pain. 12/23/19   Quintella Reichert, MD  pantoprazole (PROTONIX) 40 MG tablet Take 1 tablet (40 mg  total) by mouth daily. 12/23/19   Quintella Reichert, MD  ticagrelor (BRILINTA) 90 MG TABS tablet Take 1 tablet (90 mg total) by mouth 2 (two) times daily. 06/28/20   Quintella Reichert, MD  traZODone (DESYREL) 50 MG tablet Take 0.5-1 tablets (25-50 mg total) by mouth at bedtime as needed for sleep. 06/29/20   Sunnie Nielsen, DO    Family History Family History  Problem Relation Age of Onset  . Hypertension Mother     Social History Social History   Tobacco Use  . Smoking status: Former  Smoker    Packs/day: 1.50    Years: 45.00    Pack years: 67.50    Types: Cigarettes    Quit date: 11/25/2019    Years since quitting: 0.8  . Smokeless tobacco: Never Used  . Tobacco comment: as of 07/15/18 0.5 ppd  Vaping Use  . Vaping Use: Never used  Substance Use Topics  . Alcohol use: Never    Alcohol/week: 0.0 standard drinks  . Drug use: Not Currently     Allergies   Patient has no known allergies.   Review of Systems Review of Systems  Constitutional: Negative for diaphoresis and fatigue.  Respiratory: Positive for chest tightness.   Cardiovascular: Positive for chest pain. Negative for palpitations.  Musculoskeletal: Positive for back pain.  Neurological: Negative for dizziness, light-headedness and headaches.     Physical Exam Triage Vital Signs ED Triage Vitals  Enc Vitals Group     BP 09/13/20 1623 (!) 173/103     Pulse Rate 09/13/20 1623 95     Resp 09/13/20 1623 18     Temp 09/13/20 1623 98.6 F (37 C)     Temp Source 09/13/20 1623 Oral     SpO2 09/13/20 1623 95 %     Weight --      Height --      Head Circumference --      Peak Flow --      Pain Score 09/13/20 1622 7     Pain Loc --      Pain Edu? --      Excl. in GC? --    No data found.  Updated Vital Signs BP (!) 158/99 (BP Location: Left Arm)   Pulse 95   Temp 98.6 F (37 C) (Oral)   Resp 18   SpO2 95%   Visual Acuity Right Eye Distance:   Left Eye Distance:   Bilateral Distance:    Right Eye Near:   Left Eye Near:    Bilateral Near:     Physical Exam Vitals and nursing note reviewed.  Constitutional:      General: He is not in acute distress.    Appearance: He is well-developed and well-nourished. He is not ill-appearing, toxic-appearing or diaphoretic.  HENT:     Head: Normocephalic and atraumatic.  Eyes:     Extraocular Movements: EOM normal.  Cardiovascular:     Rate and Rhythm: Normal rate and regular rhythm.  Pulmonary:     Effort: Pulmonary effort is normal.      Breath sounds: No decreased breath sounds, wheezing, rhonchi or rales.  Chest:     Chest wall: No tenderness.  Musculoskeletal:        General: Normal range of motion.     Cervical back: Normal range of motion and neck supple.  Skin:    General: Skin is warm and dry.  Neurological:     Mental Status: He is alert and  oriented to person, place, and time.  Psychiatric:        Mood and Affect: Mood and affect normal.        Behavior: Behavior normal.      UC Treatments / Results  Labs (all labs ordered are listed, but only abnormal results are displayed) Labs Reviewed - No data to display  EKG Date/Time:09/13/2020    16:29:39 Ventricular Rate: 88 PR Interval: 178 QRS Duration: 92 QT Interval: 376 QTC Calculation: 454 P-R-T axes: 47  66  38 Text Interpretation: Sinus rhythm with premature atrial complexes with aberrant conduction. Incomplete Right bundle branch block. Borderline ECG  Last available ECG was 11/25/19, showed normal sinus rhythm, ST & T wave abnormality, consider lateral ischemia. Abnormal ECG.   Radiology No results found.  Procedures Procedures (including critical care time)  Medications Ordered in UC Medications - No data to display  Initial Impression / Assessment and Plan / UC Course  I have reviewed the triage vital signs and the nursing notes.  Pertinent labs & imaging results that were available during my care of the patient were reviewed by me and considered in my medical decision making (see chart for details).     Due to pt's history and description of pain, recommend further evaluation in emergency department. Pt understanding and agreeable to go to emergency department Declined EMS transport. Pt discharged in stable condition.   Final Clinical Impressions(s) / UC Diagnoses   Final diagnoses:  Chest pain, unspecified type  Right bundle branch block  History of coronary artery disease     Discharge Instructions      You have  declined EMS transport. Please drive yourself directly to the emergency department for further evaluation and treatment of your symptoms.  The hospital has labs and resources not available at our urgent care.     ED Prescriptions    None     PDMP not reviewed this encounter.   Lurene Shadow, [place] 09/13/20 1708

## 2020-09-14 ENCOUNTER — Encounter: Payer: Self-pay | Admitting: Osteopathic Medicine

## 2020-09-14 ENCOUNTER — Other Ambulatory Visit: Payer: Self-pay

## 2020-09-14 ENCOUNTER — Ambulatory Visit (INDEPENDENT_AMBULATORY_CARE_PROVIDER_SITE_OTHER): Payer: 59 | Admitting: Osteopathic Medicine

## 2020-09-14 VITALS — BP 127/85 | HR 84 | Temp 98.5°F | Wt 210.1 lb

## 2020-09-14 DIAGNOSIS — S46912A Strain of unspecified muscle, fascia and tendon at shoulder and upper arm level, left arm, initial encounter: Secondary | ICD-10-CM

## 2020-09-14 DIAGNOSIS — Z9861 Coronary angioplasty status: Secondary | ICD-10-CM | POA: Diagnosis not present

## 2020-09-14 DIAGNOSIS — I2510 Atherosclerotic heart disease of native coronary artery without angina pectoris: Secondary | ICD-10-CM | POA: Diagnosis not present

## 2020-09-14 MED ORDER — IBUPROFEN 600 MG PO TABS: 600.0000 mg | ORAL_TABLET | Freq: Three times a day (TID) | ORAL | 0 refills | Status: DC | PRN

## 2020-09-14 MED ORDER — KETOROLAC TROMETHAMINE 60 MG/2ML IM SOLN
60.0000 mg | Freq: Once | INTRAMUSCULAR | Status: AC
  Administered 2020-09-14: 60 mg via INTRAMUSCULAR

## 2020-09-14 MED ORDER — OXYCODONE-ACETAMINOPHEN 5-325 MG PO TABS: 1.0000 | ORAL_TABLET | Freq: Three times a day (TID) | ORAL | 0 refills | Status: DC | PRN

## 2020-09-14 MED ORDER — OXYCODONE-ACETAMINOPHEN 5-325 MG PO TABS: 1.0000 | ORAL_TABLET | Freq: Three times a day (TID) | ORAL | 0 refills | Status: AC | PRN

## 2020-09-14 MED ORDER — CYCLOBENZAPRINE HCL 10 MG PO TABS: 5.0000 mg | ORAL_TABLET | Freq: Three times a day (TID) | ORAL | 1 refills | Status: DC | PRN

## 2020-09-14 NOTE — Patient Instructions (Addendum)
I think this is all a muscle problem with shoulder area, specifically pectoral (chest) muscle and rhomboid (upper back / shoulder blade) muscle, possible some arthritis in shoulder joint ar acromioclavicular joint (where clavicle aka collarbone meets the shoulder in the fornt)   Plan: Would still call cardiologist if pain is not better or if chest pressure  See printed instructions for home exercises / stretches Shot of anti-inflammatory today Other prescriptions:  Anti-inflammatory (ibuprofen 600 mg) take daily for 5-7 days then can keep on had for use as-needed after that  Muscle relaxer (cyclobenzaprine) as needed for muscle spasm - may help to take in the evening / bedtime   Severe pain (oxycodone) medication to use as needed  If not better / if worse, please let me know and I can refer for formal physical therapy or an appointment with Dr T if PT not helping

## 2020-09-14 NOTE — Progress Notes (Signed)
HPI: Carlos Valentine is a 65 y.o. male who  has a past medical history of Hyperlipidemia, Hypertension, Obesity, and Tobacco use.  he presents to [facility] today, 09/14/20,  for chief complaint of: ED follow up   Patient was seen at [facility] ED last night due to 17 hours of constant 8/10 L sided chest tightness radiating to the back that he described as feeling like a pulled muscle. He was given 243 mg ASA without improvement of pain. Normal tropinin, and CXR. EKG showed non specific repolarization disturbance.     Chest pain is on going and present today.  . 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 Location: L lateral shoulder, L upper outer corner of chest, L shoulder near [facility] joint . Quality: tight/sharp  . Severity: 6-7/10 improved since yesterday . Duration: 36 hours . Modifying factors: lifting or rotating L arm makes it worse  . Assoc signs/symptoms: none o No nausea, diaphoresis, abdominal pain, radiation down arm or to jaw, shortness of breath, leg pain or swelling, palpitations, no chest pain on exertion     Past medical, surgical, social and family history reviewed:  Patient Active Problem List   Diagnosis Date Noted  . CAD S/P percutaneous coronary angioplasty 11/25/2019  . Palpitations 11/22/2019  . Syncope and collapse   . Essential hypertension   . Chest pain of uncertain etiology   . SVT (supraventricular tachycardia) (HCC) 11/20/2019  . Syncope 11/20/2019  . Peripheral edema 11/21/2018  . Prediabetes 11/11/2018  . Glucosuria 10/20/2018  . Nocturia 10/18/2018  . Encounter for screening for malignant neoplasm of respiratory organs 10/18/2018  . Smokers' cough (HCC) 07/21/2018  . Mixed hyperlipidemia 07/21/2018  . Hypertension goal BP (blood pressure) < 130/80 07/21/2018  . Encounter for tobacco use cessation counseling 09/22/2015  . Tobacco use 09/22/2015    Past Surgical  History:  Procedure Laterality Date  . CORONARY STENT INTERVENTION N/A 11/24/2019   Procedure: CORONARY STENT INTERVENTION;  Surgeon: Tonny Bollman, MD;  Location: [facility] INVASIVE CV LAB;  Service: Cardiovascular;  Laterality: N/A;  . INTRAVASCULAR ULTRASOUND/IVUS N/A 11/24/2019   Procedure: Intravascular Ultrasound/IVUS;  Surgeon: Tonny Bollman, MD;  Location: [facility] INVASIVE CV LAB;  Service: Cardiovascular;  Laterality: N/A;  . LEFT HEART CATH AND CORONARY ANGIOGRAPHY N/A 11/24/2019   Procedure: LEFT HEART CATH AND CORONARY ANGIOGRAPHY;  Surgeon: Tonny Bollman, MD;  Location: [facility] INVASIVE CV LAB;  Service: Cardiovascular;  Laterality: N/A;    Social History   Tobacco Use  . Smoking status: Former Smoker    Packs/day: 1.50    Years: 45.00    Pack years: 67.50    Types: Cigarettes    Quit date: 11/25/2019    Years since quitting: 0.8  . Smokeless tobacco: Never Used  . Tobacco comment: as of 07/15/18 0.5 ppd  Substance Use Topics  . Alcohol use: Never    Alcohol/week: 0.0 standard drinks    Family History  Problem Relation Age of Onset  . Hypertension Mother      Current medication list and allergy/intolerance information reviewed:    Current Outpatient Medications  Medication Sig Dispense Refill  . acetaminophen (TYLENOL) 500 MG tablet Take 500 mg by mouth every 6 (six) hours as needed for mild pain or headache.    Marland Kitchen amLODipine (NORVASC) 5 MG tablet TAKE 1 TABLET BY MOUTH AT BEDTIME . APPOINTMENT REQUIRED FOR FUTURE REFILLS 90 tablet 0  . aspirin 81 MG  tablet Take 81 mg by mouth in the morning.     Marland Kitchen atorvastatin (LIPITOR) 80 MG tablet Take 1 tablet (80 mg total) by mouth daily. 90 tablet 3  . cyclobenzaprine (FLEXERIL) 10 MG tablet Take 0.5-1 tablets (5-10 mg total) by mouth 3 (three) times daily as needed for muscle spasms. Caution: can cause drowsiness 60 tablet 1  . ibuprofen (ADVIL) 600 MG tablet Take 1 tablet (600 mg total) by mouth every 8 (eight) hours as needed for  moderate pain. 30 tablet 0  . irbesartan (AVAPRO) 75 MG tablet TAKE 1 TABLET BY MOUTH IN THE MORNING . APPOINTMENT REQUIRED FOR FUTURE REFILLS 30 tablet 0  . metoprolol succinate (TOPROL-XL) 50 MG 24 hr tablet Take 1 tablet (50 mg total) by mouth daily. Take with or immediately following a meal. 90 tablet 3  . nitroGLYCERIN (NITROSTAT) 0.4 MG SL tablet Place 1 tablet (0.4 mg total) under the tongue every 5 (five) minutes x 3 doses as needed for chest pain. 25 tablet 6  . pantoprazole (PROTONIX) 40 MG tablet Take 1 tablet (40 mg total) by mouth daily. 90 tablet 3  . ticagrelor (BRILINTA) 90 MG TABS tablet Take 1 tablet (90 mg total) by mouth 2 (two) times daily. 180 tablet 2  . traZODone (DESYREL) 50 MG tablet Take 0.5-1 tablets (25-50 mg total) by mouth at bedtime as needed for sleep. 30 tablet 3  . oxyCODONE-acetaminophen (PERCOCET/ROXICET) 5-325 MG tablet Take 1 tablet by mouth every 8 (eight) hours as needed for up to 5 days for severe pain. 5 tablet 0   No current facility-administered medications for this visit.    No Known Allergies    Review of Systems:  Constitutional:  No  fever, no chills, No recent illness   HEENT: No  headachec  Cardiac: +  chest pain, No  pressure, No palpitations  Respiratory:  No  shortness of breath.  Gastrointestinal: No  abdominal pain, No  nausea, No  vomiting  Musculoskeletal: + new myalgia/arthralgia  Skin: No  Rash  Exam:  BP 127/85 (BP Location: Left Arm, Patient Position: Sitting, Cuff Size: Normal)   Pulse 84   Temp 98.5 F (36.9 C) (Oral)   Wt 210 lb 1.9 oz (95.3 kg)   BMI 33.91 kg/m   Constitutional: VS see above. General Appearance: alert, well-developed, well-nourished, NAD  Eyes: Normal lids and conjunctive, non-icteric sclera  Respiratory: Normal respiratory effort. no wheeze, no rhonchi, no rales  Cardiovascular: S1/S2 normal, no murmur, no rub/gallop auscultated. RRR. No lower extremity edema.  Musculoskeletal: Gait  normal. Pain with rotation and raising of L shoulder; Some tenderness to palpation on the L back. (+)tender point in L rhomboid and at L [facility] joint   Skin: warm, dry, intact. No rash/ulcer. No concerning nevi or subq nodules on limited exam.     No results found for this or any previous visit (from the past 72 hour(s)).  No results found.   ASSESSMENT/PLAN: The primary encounter diagnosis was Strain of left shoulder, initial encounter. A diagnosis of CAD S/P percutaneous coronary angioplasty was also pertinent to this visit.   Chest/back pain - no red flag symptoms and ED/urgent care work up was unremarkable for any cardiac etiology. Pain seems so be musculoskeletal in nature   IM injection of toradol   If pain does not improve within the next week please follow up with Dr. Karie Schwalbe with sports medicine v referral for PT  Rx flexeril 10 mg  Rx percocet 5-325 mg  Rx advil 600 mg  At home instructions for muscle stretches and strengthening exercises  CAD s/p PCA  Follow up with cardiologist if pain does not improve   No orders of the defined types were placed in this encounter.   Meds ordered this encounter  Medications  . cyclobenzaprine (FLEXERIL) 10 MG tablet    Sig: Take 0.5-1 tablets (5-10 mg total) by mouth 3 (three) times daily as needed for muscle spasms. Caution: can cause drowsiness    Dispense:  60 tablet    Refill:  1  . ibuprofen (ADVIL) 600 MG tablet    Sig: Take 1 tablet (600 mg total) by mouth every 8 (eight) hours as needed for moderate pain.    Dispense:  30 tablet    Refill:  0  . DISCONTD: oxyCODONE-acetaminophen (PERCOCET/ROXICET) 5-325 MG tablet    Sig: Take 1 tablet by mouth every 8 (eight) hours as needed for up to 5 days for severe pain.    Dispense:  5 tablet    Refill:  0  . oxyCODONE-acetaminophen (PERCOCET/ROXICET) 5-325 MG tablet    Sig: Take 1 tablet by mouth every 8 (eight) hours as needed for up to 5 days for severe pain.    Dispense:  5 tablet     Refill:  0  . ketorolac (TORADOL) injection 60 mg    Patient Instructions  I think this is all a muscle problem with shoulder area, specifically pectoral (chest) muscle and rhomboid (upper back / shoulder blade) muscle, possible some arthritis in shoulder joint ar acromioclavicular joint (where clavicle aka collarbone meets the shoulder in the fornt)   Plan: Would still call cardiologist if pain is not better or if chest pressure  See printed instructions for home exercises / stretches Shot of anti-inflammatory today Other prescriptions:  Anti-inflammatory (ibuprofen 600 mg) take daily for 5-7 days then can keep on had for use as-needed after that  Muscle relaxer (cyclobenzaprine) as needed for muscle spasm - may help to take in the evening / bedtime   Severe pain (oxycodone) medication to use as needed  If not better / if worse, please let me know and I can refer for formal physical therapy or an appointment with Dr T if PT not helping                Visit summary with medication list and pertinent instructions was printed for patient to review. All questions at time of visit were answered - patient instructed to contact office with any additional concerns or updates. ER/RTC precautions were reviewed with the patient.     Please note: voice recognition software was used to produce this document, and typos may escape review. Please contact Dr. Lyn Hollingshead for any needed clarifications.     Follow-up plan: Return if symptoms worsen or fail to improve.

## 2020-09-20 ENCOUNTER — Other Ambulatory Visit: Payer: Self-pay | Admitting: Osteopathic Medicine

## 2020-09-20 DIAGNOSIS — I10 Essential (primary) hypertension: Secondary | ICD-10-CM

## 2020-10-08 ENCOUNTER — Telehealth: Payer: Self-pay | Admitting: Osteopathic Medicine

## 2020-10-08 DIAGNOSIS — I10 Essential (primary) hypertension: Secondary | ICD-10-CM

## 2020-10-08 MED ORDER — IRBESARTAN 75 MG PO TABS: 75.0000 mg | ORAL_TABLET | Freq: Every morning | ORAL | 1 refills | Status: DC

## 2020-10-08 NOTE — Telephone Encounter (Signed)
Patient came in stating that he is almost out of medication listed below & that he was just in for an appointment not even a month ago (09/14/20) & didn't know if he needed another appt to have this refilled and needs a call back regarding this. AM  irbesartan (AVAPRO) 75 MG tablet

## 2020-10-08 NOTE — Telephone Encounter (Signed)
Prescription sent

## 2020-10-08 NOTE — Telephone Encounter (Signed)
LVM letting patient know RX has been sent into pharmacy. AM

## 2020-10-24 ENCOUNTER — Other Ambulatory Visit: Payer: Self-pay | Admitting: Osteopathic Medicine

## 2020-11-07 ENCOUNTER — Encounter: Payer: Self-pay | Admitting: Emergency Medicine

## 2020-11-07 ENCOUNTER — Other Ambulatory Visit: Payer: Self-pay

## 2020-11-07 ENCOUNTER — Emergency Department (INDEPENDENT_AMBULATORY_CARE_PROVIDER_SITE_OTHER): Payer: 59

## 2020-11-07 ENCOUNTER — Emergency Department (INDEPENDENT_AMBULATORY_CARE_PROVIDER_SITE_OTHER)
Admission: EM | Admit: 2020-11-07 | Discharge: 2020-11-07 | Disposition: A | Discharge status: Home / Self Care | Payer: 59 | Source: Home / Self Care

## 2020-11-07 DIAGNOSIS — M25462 Effusion, left knee: Secondary | ICD-10-CM

## 2020-11-07 DIAGNOSIS — M25562 Pain in left knee: Secondary | ICD-10-CM

## 2020-11-07 DIAGNOSIS — M7989 Other specified soft tissue disorders: Secondary | ICD-10-CM

## 2020-11-07 DIAGNOSIS — M1120 Other chondrocalcinosis, unspecified site: Secondary | ICD-10-CM

## 2020-11-07 IMAGING — DX DG KNEE COMPLETE 4+V*L*
4 series · 4 of 4 positions shown · non-contrast
Comparison: None.

CLINICAL DATA: Swelling

EXAM:
LEFT KNEE - COMPLETE 4+ VIEW

[knee ap]
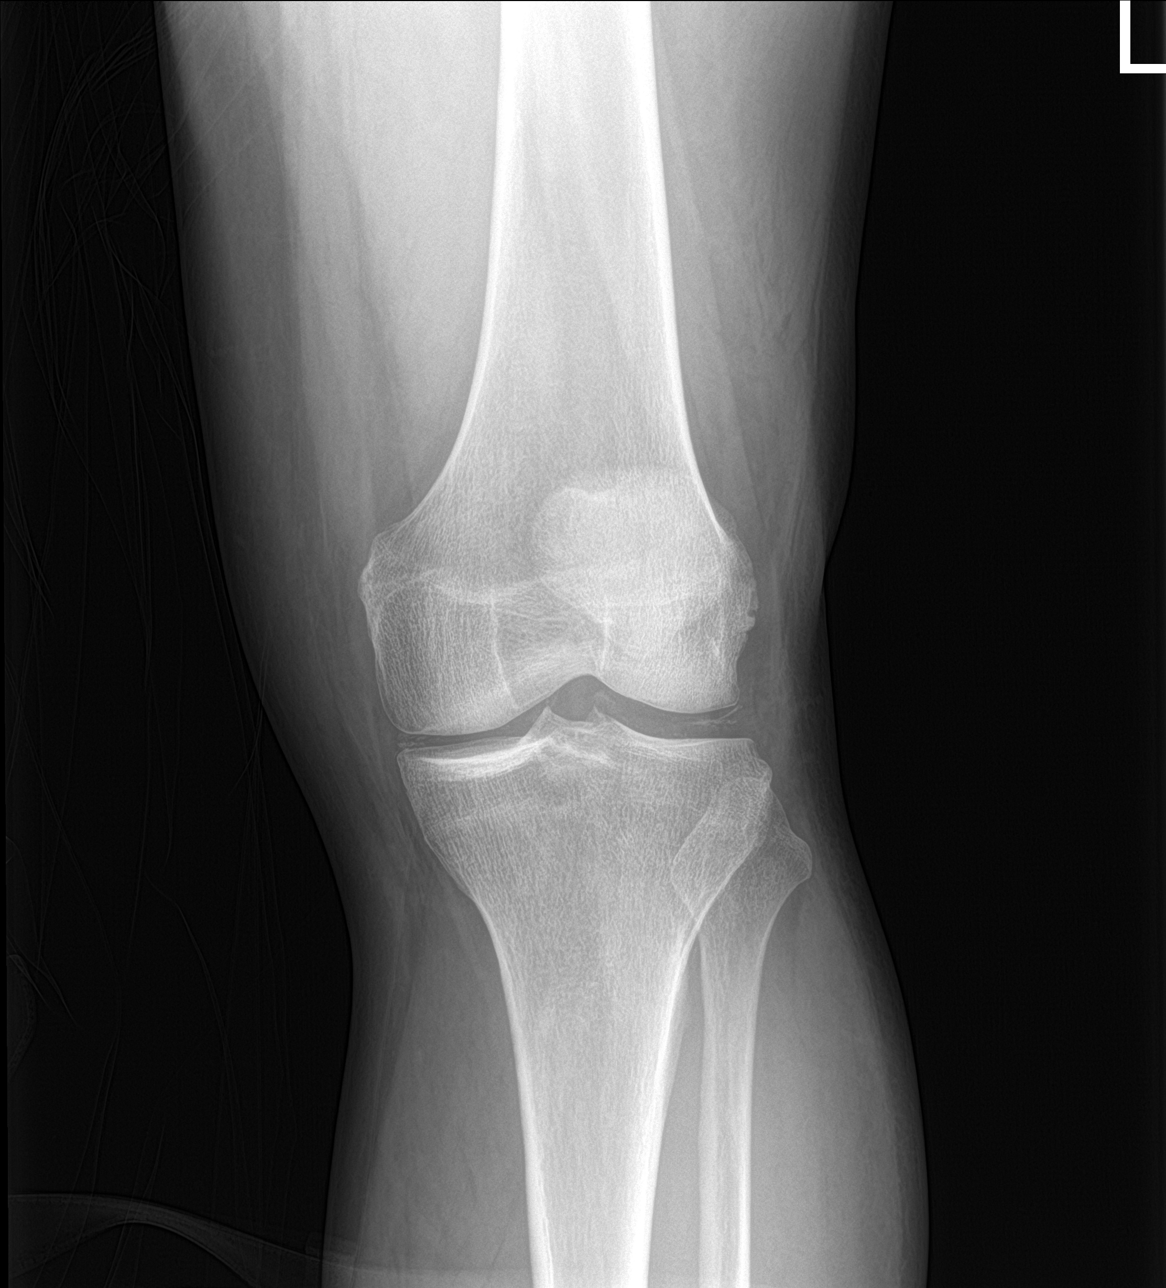

[knee lat]
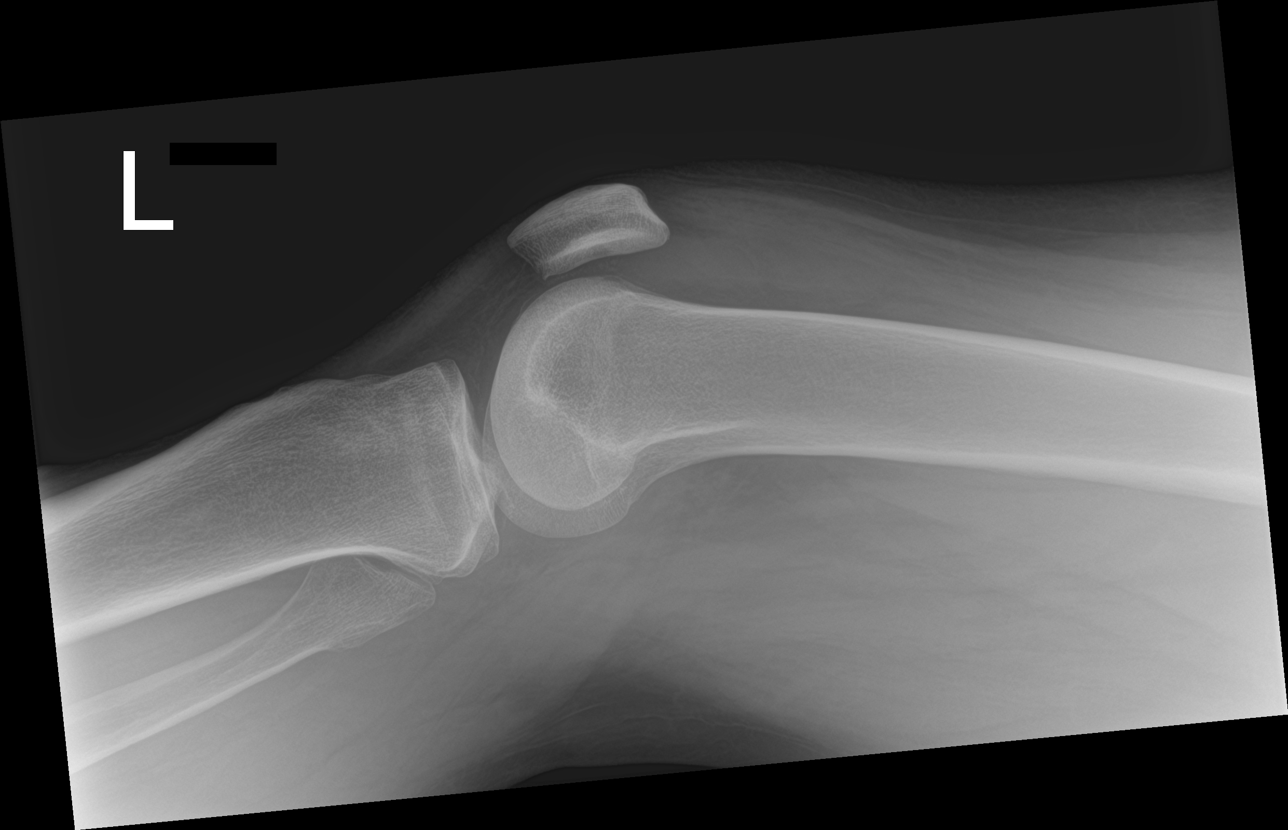

[knee obl (1 of 2)]
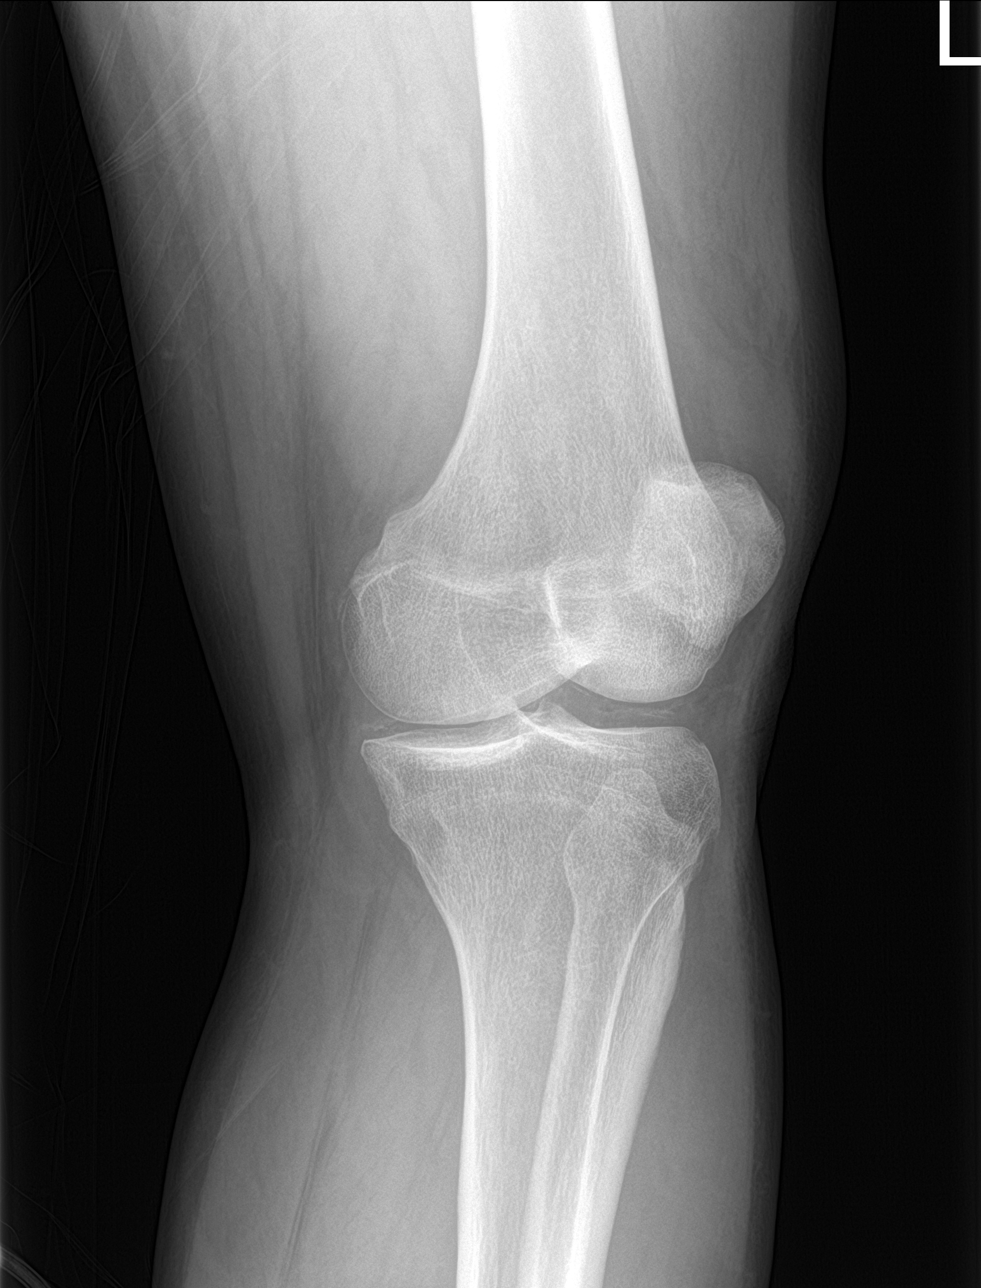

[knee obl (2 of 2)]
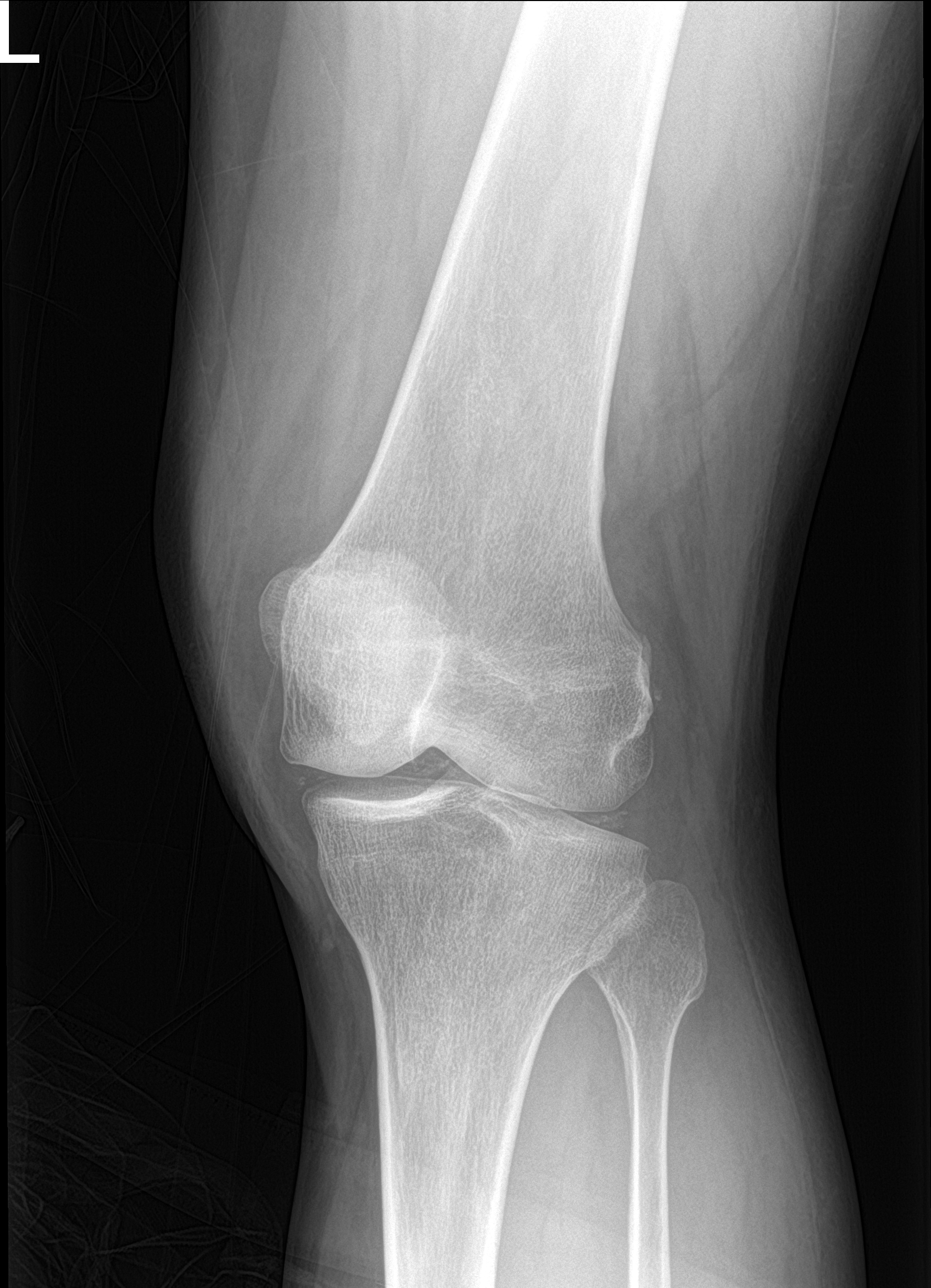

[4 of 4 positions shown; findings below may reference images not displayed]

FINDINGS: Osteopenia. No acute fracture or dislocation. Chondrocalcinosis.
Relative preservation of the knee joints. No area of erosion or
osseous destruction. No unexpected radiopaque foreign body. Moderate
joint effusion.
IMPRESSION: 1.  No acute fracture or dislocation. Moderate joint effusion.
2. Chondrocalcinosis which could reflect underlying CPPD.

## 2020-11-07 MED ORDER — PREDNISONE 20 MG PO TABS: 20.0000 mg | ORAL_TABLET | Freq: Every day | ORAL | 0 refills | Status: DC

## 2020-11-07 MED ORDER — KETOROLAC TROMETHAMINE 30 MG/ML IJ SOLN
30.0000 mg | Freq: Once | INTRAMUSCULAR | Status: AC
  Administered 2020-11-07: 30 mg via INTRAMUSCULAR

## 2020-11-07 MED ORDER — DEXAMETHASONE SODIUM PHOSPHATE 10 MG/ML IJ SOLN
10.0000 mg | Freq: Once | INTRAMUSCULAR | Status: AC
  Administered 2020-11-07: 10 mg via INTRAMUSCULAR

## 2020-11-07 NOTE — ED Provider Notes (Signed)
[facility] CARE CENTER   [id number] 11/07/20 Arrival Time: 1308  RK:[id number] PAIN  SUBJECTIVE: History from: patient. Carlos Valentine is a 65 y.o. male complains of L leg and knee pain that began yesterday after a fall. Denies a precipitating event or specific injury. Localizes the pain to the posterior knee. Describes the pain as constant and achy with intermittent sharp pain. Has tried OTC medications without relief. Symptoms are made worse with activity. Denies similar symptoms in the past. Denies fever, chills, erythema, ecchymosis, weakness, numbness and tingling, saddle paresthesias, loss of bowel or bladder function.      ROS: As per HPI.  All other pertinent ROS negative.     Past Medical History:  Diagnosis Date  . Hyperlipidemia   . Hypertension   . Obesity   . Tobacco use    Past Surgical History:  Procedure Laterality Date  . CORONARY STENT INTERVENTION N/A 11/24/2019   Procedure: CORONARY STENT INTERVENTION;  Surgeon: Tonny Bollman, MD;  Location: [facility] INVASIVE CV LAB;  Service: Cardiovascular;  Laterality: N/A;  . INTRAVASCULAR ULTRASOUND/IVUS N/A 11/24/2019   Procedure: Intravascular Ultrasound/IVUS;  Surgeon: Tonny Bollman, MD;  Location: [facility] INVASIVE CV LAB;  Service: Cardiovascular;  Laterality: N/A;  . LEFT HEART CATH AND CORONARY ANGIOGRAPHY N/A 11/24/2019   Procedure: LEFT HEART CATH AND CORONARY ANGIOGRAPHY;  Surgeon: Tonny Bollman, MD;  Location: [facility] INVASIVE CV LAB;  Service: Cardiovascular;  Laterality: N/A;   No Known Allergies No current facility-administered medications on file prior to encounter.   Current Outpatient Medications on File Prior to Encounter  Medication Sig Dispense Refill  . acetaminophen (TYLENOL) 500 MG tablet Take 500 mg by mouth every 6 (six) hours as needed for mild pain or headache.    Marland Kitchen amLODipine (NORVASC) 5 MG tablet TAKE 1 TABLET BY MOUTH AT BEDTIME . APPOINTMENT REQUIRED FOR FUTURE REFILLS 90 tablet 0  . aspirin 81 MG tablet Take  81 mg by mouth in the morning.     Marland Kitchen atorvastatin (LIPITOR) 80 MG tablet Take 1 tablet (80 mg total) by mouth daily. 90 tablet 3  . cyclobenzaprine (FLEXERIL) 10 MG tablet Take 0.5-1 tablets (5-10 mg total) by mouth 3 (three) times daily as needed for muscle spasms. Caution: can cause drowsiness 60 tablet 1  . irbesartan (AVAPRO) 75 MG tablet Take 1 tablet (75 mg total) by mouth in the morning. 90 tablet 1  . metoprolol succinate (TOPROL-XL) 50 MG 24 hr tablet Take 1 tablet (50 mg total) by mouth daily. Take with or immediately following a meal. 90 tablet 3  . nitroGLYCERIN (NITROSTAT) 0.4 MG SL tablet Place 1 tablet (0.4 mg total) under the tongue every 5 (five) minutes x 3 doses as needed for chest pain. 25 tablet 6  . pantoprazole (PROTONIX) 40 MG tablet Take 1 tablet (40 mg total) by mouth daily. 90 tablet 3  . ticagrelor (BRILINTA) 90 MG TABS tablet Take 1 tablet (90 mg total) by mouth 2 (two) times daily. 180 tablet 2  . traZODone (DESYREL) 50 MG tablet TAKE 1/2 TO 1 (ONE-HALF TO ONE) TABLET BY MOUTH AT BEDTIME AS NEEDED FOR SLEEP 90 tablet 0  . ibuprofen (ADVIL) 600 MG tablet Take 1 tablet (600 mg total) by mouth every 8 (eight) hours as needed for moderate pain. 30 tablet 0   Social History   Socioeconomic History  . Marital status: Legally Separated    Spouse name: Not on file  . Number of children: Not on file  . Years of education:  Not on file  . Highest education level: Not on file  Occupational History  . Not on file  Tobacco Use  . Smoking status: Former Smoker    Packs/day: 1.50    Years: 45.00    Pack years: 67.50    Types: Cigarettes    Quit date: 11/25/2019    Years since quitting: 0.9  . Smokeless tobacco: Never Used  . Tobacco comment: as of 07/15/18 0.5 ppd  Vaping Use  . Vaping Use: Never used  Substance and Sexual Activity  . Alcohol use: Never    Alcohol/week: 0.0 standard drinks  . Drug use: Not Currently  . Sexual activity: Yes  Other Topics Concern  .  Not on file  Social History Narrative  . Not on file   Social Determinants of Health   Financial Resource Strain: Not on file  Food Insecurity: Not on file  Transportation Needs: Not on file  Physical Activity: Not on file  Stress: Not on file  Social Connections: Not on file  Intimate Partner Violence: Not on file   Family History  Problem Relation Age of Onset  . Hypertension Mother     OBJECTIVE:  Vitals:   11/07/20 1328  BP: 116/77  Pulse: 83  SpO2: 96%    General appearance: ALERT; in no acute distress.  Head: NCAT Lungs: Normal respiratory effort CV: pulses 2+ bilaterally. Cap refill < 2 seconds Musculoskeletal:  Inspection: Skin warm, dry, clear and intact No erythema, effusion to L knee Palpation: posterior L knee tender to palpation ROM: Limited ROM active and passive to L knee Skin: warm and dry Neurologic: Ambulates without difficulty; Sensation intact about the upper/ lower extremities Psychological: alert and cooperative; normal mood and affect  DIAGNOSTIC STUDIES:  No results found.   ASSESSMENT & PLAN:  1. Acute pain of left knee   2. Effusion of left knee   3. Chondrocalcinosis     Meds ordered this encounter  Medications  . dexamethasone (DECADRON) injection 10 mg  . ketorolac (TORADOL) 30 MG/ML injection 30 mg  . predniSONE (DELTASONE) 20 MG tablet    Sig: Take 1 tablet (20 mg total) by mouth daily with breakfast for 5 days.    Dispense:  5 tablet    Refill:  0    Order Specific Question:   Supervising Provider    Answer:   Merrilee Jansky [[id number]]   Decadron 10mg  IM in office today Toradol 30mg  IM in office today  Prescribed prednisone x 5 days Continue conservative management of rest, ice, and gentle stretches Take ibuprofen as needed for pain relief (may cause abdominal discomfort, ulcers, and GI bleeds avoid taking with other NSAIDs) Follow up with sports medicine Return or go to the ER if you have any new or worsening  symptoms (fever, chills, chest pain, abdominal pain, changes in bowel or bladder habits, pain radiating into lower legs)    Reviewed expectations re: course of current medical issues. Questions answered. Outlined signs and symptoms indicating need for more acute intervention. Patient verbalized understanding. After Visit Summary given.       , NP 11/09/20 [phone]

## 2020-11-07 NOTE — Discharge Instructions (Signed)
Xray is negative for fracture or dislocation  We have given you an injection for pain and inflammation today  I have prescribed prednisone for you to take 40mg  daily for 5 days  Follow up with sports medicine

## 2020-11-07 NOTE — ED Triage Notes (Signed)
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  He tripped over his own feet, felt fine, continued to work then walked his dog.  Went to sleep with some pain, up at 4am this morning with pain.  Patient is having swelling behind left knee and leg.  Patient did take Naprosyn for the discomfort.

## 2020-11-09 ENCOUNTER — Telehealth: Payer: Self-pay | Admitting: Emergency Medicine

## 2020-11-09 NOTE — Telephone Encounter (Signed)
Pt here today for continued left knee pain -wants more pain medicine - RN reviewed recent visit notes w/ Dr Delton See. Pt has not made a follow up appointment w/ sports medicine & has not been able to take time off work. RN explained that an injection into the joint was not available at [facility] but RN could make an appointment w/ Dr Jordan Likes for follow up or pt could go to ED. Appointment made for patient by RN at 1610 on Wednesday 11/10/20. Pt unable to go an earlier appointment on Wednesday due to work schedule. Ace wrap in place to left knee. Pt to go to ED tonight if pain is not tolerable. Pt stated he "would never come back here" RN explained multiple times that narcotics & joint injections were not available in the Urgent Care setting.

## 2020-11-10 ENCOUNTER — Ambulatory Visit: Payer: Self-pay

## 2020-11-10 ENCOUNTER — Other Ambulatory Visit: Payer: Self-pay

## 2020-11-10 ENCOUNTER — Ambulatory Visit (INDEPENDENT_AMBULATORY_CARE_PROVIDER_SITE_OTHER): Payer: 59 | Admitting: Family Medicine

## 2020-11-10 ENCOUNTER — Encounter: Payer: Self-pay | Admitting: Family Medicine

## 2020-11-10 ENCOUNTER — Other Ambulatory Visit: Payer: Self-pay | Admitting: Family Medicine

## 2020-11-10 VITALS — BP 146/94 | Ht 66.0 in | Wt 208.0 lb

## 2020-11-10 DIAGNOSIS — M1120 Other chondrocalcinosis, unspecified site: Secondary | ICD-10-CM | POA: Insufficient documentation

## 2020-11-10 DIAGNOSIS — M25562 Pain in left knee: Secondary | ICD-10-CM

## 2020-11-10 MED ORDER — TRIAMCINOLONE ACETONIDE 40 MG/ML IJ SUSP
40.0000 mg | Freq: Once | INTRAMUSCULAR | Status: AC
  Administered 2020-11-10: 40 mg via INTRA_ARTICULAR

## 2020-11-10 NOTE — Progress Notes (Signed)
Carlos Valentine - 65 y.o. male MRN [id number]  Date of birth: 1956/03/17  SUBJECTIVE:  Including CC & ROS.  No chief complaint on file.   Carlos Valentine is a 65 y.o. male that is presenting with acute left knee pain.  Denies any injury or inciting event.  Did get improvement with steroids.  No history of surgery.  Localized to the knee.  Independent review left knee x-ray shows chondrocalcinosis and mild medial joint space narrowing from 3/6.  Review of Systems See HPI   HISTORY: Past Medical, Surgical, Social, and Family History Reviewed & Updated per EMR.   Pertinent Historical Findings include:  Past Medical History:  Diagnosis Date  . Hyperlipidemia   . Hypertension   . Obesity   . Tobacco use     Past Surgical History:  Procedure Laterality Date  . CORONARY STENT INTERVENTION N/A 11/24/2019   Procedure: CORONARY STENT INTERVENTION;  Surgeon: Tonny Bollman, MD;  Location: [facility] INVASIVE CV LAB;  Service: Cardiovascular;  Laterality: N/A;  . INTRAVASCULAR ULTRASOUND/IVUS N/A 11/24/2019   Procedure: Intravascular Ultrasound/IVUS;  Surgeon: Tonny Bollman, MD;  Location: [facility] INVASIVE CV LAB;  Service: Cardiovascular;  Laterality: N/A;  . LEFT HEART CATH AND CORONARY ANGIOGRAPHY N/A 11/24/2019   Procedure: LEFT HEART CATH AND CORONARY ANGIOGRAPHY;  Surgeon: Tonny Bollman, MD;  Location: [facility] INVASIVE CV LAB;  Service: Cardiovascular;  Laterality: N/A;    Family History  Problem Relation Age of Onset  . Hypertension Mother     Social History   Socioeconomic History  . Marital status: Legally Separated    Spouse name: Not on file  . Number of children: Not on file  . Years of education: Not on file  . Highest education level: Not on file  Occupational History  . Not on file  Tobacco Use  . Smoking status: Former Smoker    Packs/day: 1.50    Years: 45.00    Pack years: 67.50    Types: Cigarettes    Quit date: 11/25/2019    Years since quitting: 0.9  . Smokeless  tobacco: Never Used  . Tobacco comment: as of 07/15/18 0.5 ppd  Vaping Use  . Vaping Use: Never used  Substance and Sexual Activity  . Alcohol use: Never    Alcohol/week: 0.0 standard drinks  . Drug use: Not Currently  . Sexual activity: Yes  Other Topics Concern  . Not on file  Social History Narrative  . Not on file   Social Determinants of Health   Financial Resource Strain: Not on file  Food Insecurity: Not on file  Transportation Needs: Not on file  Physical Activity: Not on file  Stress: Not on file  Social Connections: Not on file  Intimate Partner Violence: Not on file     PHYSICAL EXAM:  VS: BP (!) 146/94 (BP Location: Left Arm, Patient Position: Sitting, Cuff Size: Large)   Ht 5\' 6"  (1.676 m)   Wt 208 lb (94.3 kg)   BMI 33.57 kg/m  Physical Exam Gen: NAD, alert, cooperative with exam, well-appearing MSK:  Left knee: Limited flexion and extension Moderate effusion. No instability. Normal strength resistance. Neurovascular intact  Limited ultrasound: Left knee:  Moderate effusion. Normal-appearing quadricep and patellar tendon. Degenerative change of the medial meniscus. Increased hyperemia over the medial compartment and along the medial femoral condyle. Increased hyperemia over the lateral compartment. Degenerative changes of the lateral meniscus  Summary: Synovitis appreciated with effusion  Ultrasound and interpretation by , MD   Aspiration/Injection Procedure  Note Carlos Valentine [birth year]  Procedure: Aspiration and Injection Indications: Left knee pain  Procedure Details Consent: Risks of procedure as well as the alternatives and risks of each were explained to the (patient/caregiver).  Consent for procedure obtained. Time Out: Verified patient identification, verified procedure, site/side was marked, verified correct patient position, special equipment/implants available, medications/allergies/relevent history reviewed,  required imaging and test results available.  Performed.  The area was cleaned with iodine and alcohol swabs.    The left knee superior lateral suprapatellar pouch was injected using 3 cc of 1% lidocaine on a 25-gauge 1-1/2 inch needle.  An 18-gauge was used to achieve aspiration.  The syringe was switched to mixture containing 1 cc's of 40 mg Kenalog and 4 cc's of 0.25% bupivacaine was injected.  Ultrasound was used. Images were obtained in long views showing the injection.    Amount of Fluid Aspirated: 73mL Character of Fluid: straw colored and cloudy Fluid was sent RUE:AVWU count and crystal identification  A sterile dressing was applied.  Patient did tolerate procedure well.    ASSESSMENT & PLAN:   Calcium pyrophosphate deposition disease (CPPD) Symptoms seem most associated with  his underlying CPPD. -Counseled on home exercise therapy and supportive care. -Aspiration and injection. -Synovial fluid analysis. -Could consider physical therapy.

## 2020-11-10 NOTE — Patient Instructions (Signed)
Nice to meet you Please try ice as needed  Please try the exercises   We will call with the results from today   Please send me a message in MyChart with any questions or updates.  Please see me back in 4 weeks.   --Dr. Jordan Likes

## 2020-11-10 NOTE — Assessment & Plan Note (Addendum)
Symptoms seem most associated with  his underlying CPPD. -Counseled on home exercise therapy and supportive care. -Aspiration and injection. -Synovial fluid analysis. -Could consider physical therapy.

## 2020-11-11 LAB — SYNOVIAL FLUID ANALYSIS, COMPLETE
Basophils, %: 0 %
Eosinophils-Synovial: 0 % (ref 0–2)
Lymphocytes-Synovial Fld: 5 % (ref 0–74)
Monocyte/Macrophage: 0 % (ref 0–69)
Neutrophil, Synovial: 95 % — ABNORMAL HIGH (ref 0–24)
Synoviocytes, %: 0 % (ref 0–15)
WBC, Synovial: 13190 cells/uL — ABNORMAL HIGH (ref <150)

## 2020-11-15 ENCOUNTER — Telehealth: Payer: Self-pay | Admitting: Family Medicine

## 2020-11-15 NOTE — Telephone Encounter (Signed)
Left VM for patient. If he calls back please have him speak with a nurse/CMA and inform that his fluid analysis doesn't demonstrate crystals. His symptoms are likely related to the pseudogout.   If any questions then please take the best time and phone number to call and I will try to call him back.   Myra Rude, MD [facility] 11/15/2020, 8:31 AM

## 2020-12-03 ENCOUNTER — Other Ambulatory Visit: Payer: Self-pay | Admitting: *Deleted

## 2020-12-03 MED ORDER — PANTOPRAZOLE SODIUM 40 MG PO TBEC: 40.0000 mg | DELAYED_RELEASE_TABLET | Freq: Every day | ORAL | 1 refills | Status: DC

## 2020-12-06 ENCOUNTER — Ambulatory Visit (INDEPENDENT_AMBULATORY_CARE_PROVIDER_SITE_OTHER): Payer: 59 | Admitting: Family Medicine

## 2020-12-06 ENCOUNTER — Encounter: Payer: Self-pay | Admitting: Family Medicine

## 2020-12-06 ENCOUNTER — Other Ambulatory Visit: Payer: Self-pay

## 2020-12-06 VITALS — Ht 66.0 in | Wt 208.0 lb

## 2020-12-06 DIAGNOSIS — S76312A Strain of muscle, fascia and tendon of the posterior muscle group at thigh level, left thigh, initial encounter: Secondary | ICD-10-CM | POA: Diagnosis not present

## 2020-12-06 DIAGNOSIS — M1120 Other chondrocalcinosis, unspecified site: Secondary | ICD-10-CM | POA: Diagnosis not present

## 2020-12-06 MED ORDER — NAPROXEN 500 MG PO TABS: 500.0000 mg | ORAL_TABLET | Freq: Two times a day (BID) | ORAL | 1 refills | Status: AC | PRN

## 2020-12-06 NOTE — Progress Notes (Signed)
  Carlos Valentine - 65 y.o. male MRN [id number]  Date of birth: June 26, 1956  SUBJECTIVE:  Including CC & ROS.  No chief complaint on file.   Carlos Valentine is a 65 y.o. male that is following up for his left knee pain. Improvement with the steroid injection.    Review of Systems See HPI   HISTORY: Past Medical, Surgical, Social, and Family History Reviewed & Updated per EMR.   Pertinent Historical Findings include:  Past Medical History:  Diagnosis Date  . Hyperlipidemia   . Hypertension   . Obesity   . Tobacco use     Past Surgical History:  Procedure Laterality Date  . CORONARY STENT INTERVENTION N/A 11/24/2019   Procedure: CORONARY STENT INTERVENTION;  Surgeon: Tonny Bollman, MD;  Location: [facility] INVASIVE CV LAB;  Service: Cardiovascular;  Laterality: N/A;  . INTRAVASCULAR ULTRASOUND/IVUS N/A 11/24/2019   Procedure: Intravascular Ultrasound/IVUS;  Surgeon: Tonny Bollman, MD;  Location: [facility] INVASIVE CV LAB;  Service: Cardiovascular;  Laterality: N/A;  . LEFT HEART CATH AND CORONARY ANGIOGRAPHY N/A 11/24/2019   Procedure: LEFT HEART CATH AND CORONARY ANGIOGRAPHY;  Surgeon: Tonny Bollman, MD;  Location: [facility] INVASIVE CV LAB;  Service: Cardiovascular;  Laterality: N/A;    Family History  Problem Relation Age of Onset  . Hypertension Mother     Social History   Socioeconomic History  . Marital status: Legally Separated    Spouse name: Not on file  . Number of children: Not on file  . Years of education: Not on file  . Highest education level: Not on file  Occupational History  . Not on file  Tobacco Use  . Smoking status: Former Smoker    Packs/day: 1.50    Years: 45.00    Pack years: 67.50    Types: Cigarettes    Quit date: 11/25/2019    Years since quitting: 1.0  . Smokeless tobacco: Never Used  . Tobacco comment: as of 07/15/18 0.5 ppd  Vaping Use  . Vaping Use: Never used  Substance and Sexual Activity  . Alcohol use: Never    Alcohol/week: 0.0 standard  drinks  . Drug use: Not Currently  . Sexual activity: Yes  Other Topics Concern  . Not on file  Social History Narrative  . Not on file   Social Determinants of Health   Financial Resource Strain: Not on file  Food Insecurity: Not on file  Transportation Needs: Not on file  Physical Activity: Not on file  Stress: Not on file  Social Connections: Not on file  Intimate Partner Violence: Not on file     PHYSICAL EXAM:  VS: Ht 5\' 6"  (1.676 m)   Wt 208 lb (94.3 kg)   BMI 33.57 kg/m  Physical Exam Gen: NAD, alert, cooperative with exam, well-appearing MSK:  Left knee:  Mild effusion  Normal range of motion.  Neurovascularly intact       ASSESSMENT & PLAN:   Calcium pyrophosphate deposition disease (CPPD) Improvement with steroid injection. No crystals on aspiration.  - counseled on home exercise therapy and supportive  - naproxen  - could consider PT   Hamstring muscle strain, left, initial encounter Having some pain in the posterior thigh. Seems more hamstring related.  - counseled on home exercise therapy and supportive care - could consider PT

## 2020-12-06 NOTE — Assessment & Plan Note (Signed)
Improvement with steroid injection. No crystals on aspiration.  - counseled on home exercise therapy and supportive  - naproxen  - could consider PT

## 2020-12-06 NOTE — Patient Instructions (Signed)
Good to see you Please use ice as needed  Please use the naproxen as needed  Please try the hamstring exercises   Please send me a message in MyChart with any questions or updates.  Please see me back in 4 weeks or as needed if better .   --Dr. Jordan Likes

## 2020-12-06 NOTE — Assessment & Plan Note (Signed)
Having some pain in the posterior thigh. Seems more hamstring related.  - counseled on home exercise therapy and supportive care - could consider PT

## 2020-12-07 ENCOUNTER — Ambulatory Visit: Payer: 59 | Admitting: Family Medicine

## 2020-12-08 ENCOUNTER — Other Ambulatory Visit: Payer: Self-pay | Admitting: Family Medicine

## 2020-12-18 ENCOUNTER — Other Ambulatory Visit: Payer: Self-pay | Admitting: Internal Medicine

## 2020-12-18 ENCOUNTER — Other Ambulatory Visit: Payer: Self-pay | Admitting: Cardiology

## 2020-12-20 ENCOUNTER — Other Ambulatory Visit: Payer: Self-pay

## 2020-12-20 MED ORDER — METOPROLOL SUCCINATE ER 50 MG PO TB24: 50.0000 mg | ORAL_TABLET | Freq: Every day | ORAL | 0 refills | Status: DC

## 2020-12-20 MED ORDER — ATORVASTATIN CALCIUM 80 MG PO TABS: 80.0000 mg | ORAL_TABLET | Freq: Every day | ORAL | 0 refills | Status: DC

## 2021-01-03 ENCOUNTER — Ambulatory Visit: Payer: 59 | Admitting: Family Medicine

## 2021-02-20 ENCOUNTER — Other Ambulatory Visit: Payer: Self-pay | Admitting: Osteopathic Medicine

## 2021-02-20 ENCOUNTER — Other Ambulatory Visit: Payer: Self-pay | Admitting: Cardiology

## 2021-03-01 ENCOUNTER — Other Ambulatory Visit: Payer: Self-pay | Admitting: Cardiology

## 2021-03-03 ENCOUNTER — Other Ambulatory Visit: Payer: Self-pay

## 2021-03-03 ENCOUNTER — Ambulatory Visit (INDEPENDENT_AMBULATORY_CARE_PROVIDER_SITE_OTHER): Payer: 59 | Admitting: Osteopathic Medicine

## 2021-03-03 VITALS — BP 118/77 | HR 78 | Wt 208.0 lb

## 2021-03-03 DIAGNOSIS — I471 Supraventricular tachycardia: Secondary | ICD-10-CM

## 2021-03-03 DIAGNOSIS — I10 Essential (primary) hypertension: Secondary | ICD-10-CM | POA: Diagnosis not present

## 2021-03-03 DIAGNOSIS — E782 Mixed hyperlipidemia: Secondary | ICD-10-CM | POA: Diagnosis not present

## 2021-03-03 DIAGNOSIS — I2510 Atherosclerotic heart disease of native coronary artery without angina pectoris: Secondary | ICD-10-CM

## 2021-03-03 DIAGNOSIS — Z9861 Coronary angioplasty status: Secondary | ICD-10-CM

## 2021-03-03 DIAGNOSIS — Z125 Encounter for screening for malignant neoplasm of prostate: Secondary | ICD-10-CM

## 2021-03-03 DIAGNOSIS — Z720 Tobacco use: Secondary | ICD-10-CM

## 2021-03-03 DIAGNOSIS — Z131 Encounter for screening for diabetes mellitus: Secondary | ICD-10-CM

## 2021-03-03 MED ORDER — PANTOPRAZOLE SODIUM 40 MG PO TBEC: 40.0000 mg | DELAYED_RELEASE_TABLET | Freq: Every day | ORAL | 3 refills | Status: DC

## 2021-03-03 MED ORDER — AMLODIPINE BESYLATE 5 MG PO TABS: ORAL_TABLET | ORAL | 3 refills | Status: DC

## 2021-03-03 MED ORDER — ATORVASTATIN CALCIUM 80 MG PO TABS: 80.0000 mg | ORAL_TABLET | Freq: Every day | ORAL | 0 refills | Status: DC

## 2021-03-03 MED ORDER — TRAZODONE HCL 50 MG PO TABS: ORAL_TABLET | ORAL | 3 refills | Status: DC

## 2021-03-03 MED ORDER — IRBESARTAN 75 MG PO TABS
75.0000 mg | ORAL_TABLET | Freq: Every morning | ORAL | 3 refills | Status: DC
Start: 2021-03-03 — End: 2022-03-13

## 2021-03-03 MED ORDER — METOPROLOL SUCCINATE ER 50 MG PO TB24: 50.0000 mg | ORAL_TABLET | Freq: Every day | ORAL | 0 refills | Status: DC

## 2021-03-03 MED ORDER — TICAGRELOR 90 MG PO TABS: 90.0000 mg | ORAL_TABLET | Freq: Two times a day (BID) | ORAL | 0 refills | Status: DC

## 2021-03-03 NOTE — Progress Notes (Signed)
Carlos Valentine is a 65 y.o. male who presents to  [facility]  today, 03/03/21, seeking care for the following:  Med refills - I think he is referring to meds Rx from cardiology - he has a follow-up scheduled, but not for another few months.  Otherwise no complaitns and doing well     ASSESSMENT & PLAN with other pertinent findings:  The primary encounter diagnosis was SVT (supraventricular tachycardia) (HCC). Diagnoses of Hypertension goal BP (blood pressure) < 130/80, CAD S/P percutaneous coronary angioplasty, Mixed hyperlipidemia, Tobacco use, Screening for diabetes mellitus, and Screening for prostate cancer were also pertinent to this visit.    Patient Instructions  Dr Mayford Knife (cardiology) needs to see you to continue your medicines / monitor heart health follow-up. I refilled Dr Norris Cross medicines for 90 days but you MUST see her as directed to continue further refills. I sent a year's worth of the refills for medicines that I am responsible for.    Orders Placed This Encounter  Procedures   CBC   COMPLETE METABOLIC PANEL WITH GFR   Lipid panel   Hemoglobin A1c   TSH   PSA, Total with Reflex to PSA, Free    Meds ordered this encounter  Medications   atorvastatin (LIPITOR) 80 MG tablet    Sig: Take 1 tablet (80 mg total) by mouth daily. Please make yearly appt with Dr. Mayford Knife for June 2022 for future refills. Thank you 1st attempt    Dispense:  90 tablet    Refill:  0    Please call our office to schedule an overdue appointment with Dr. Mayford Knife for June 2022 before anymore refills. [phone]. Thank you 1st attempt   metoprolol succinate (TOPROL-XL) 50 MG 24 hr tablet    Sig: Take 1 tablet (50 mg total) by mouth daily. Take with or immediately following a meal. Please make yearly appt with Dr. Mayford Knife for June 2022 for future refills. Thank you 1st attempt    Dispense:  90 tablet    Refill:  0    Please call our  office to schedule an overdue appointment with Dr. Mayford Knife for June 2022 before anymore refills. [phone]. Thank you 1st attempt   ticagrelor (BRILINTA) 90 MG TABS tablet    Sig: Take 1 tablet (90 mg total) by mouth 2 (two) times daily.    Dispense:  180 tablet    Refill:  0   amLODipine (NORVASC) 5 MG tablet    Sig: TAKE 1 TABLET BY MOUTH AT BEDTIME    Dispense:  90 tablet    Refill:  3   irbesartan (AVAPRO) 75 MG tablet    Sig: Take 1 tablet (75 mg total) by mouth in the morning.    Dispense:  90 tablet    Refill:  3   pantoprazole (PROTONIX) 40 MG tablet    Sig: Take 1 tablet (40 mg total) by mouth daily.    Dispense:  90 tablet    Refill:  3   traZODone (DESYREL) 50 MG tablet    Sig: TAKE 1/2 TO 1 (ONE-HALF TO ONE) TABLET BY MOUTH AT BEDTIME AS NEEDED FOR SLEEP    Dispense:  90 tablet    Refill:  3     See below for relevant physical exam findings  See below for recent lab and imaging results reviewed  Medications, allergies, PMH, PSH, SocH, FamH reviewed below    Follow-up instructions: Return in about 6 months (around  09/02/2021) for ANNUAL (call week prior to visit for lab orders), SEE [place] SOONER IF NEEDED. CALL/MESSAGE W/ QUESTIONS.                                        Exam:  BP 118/77   Pulse 78   Wt 208 lb (94.3 kg)   SpO2 95%   BMI 33.57 kg/m  Constitutional: VS see above. General Appearance: alert, well-developed, well-nourished, NAD Neck: No masses, trachea midline.  Respiratory: Normal respiratory effort. no wheeze, no rhonchi, no rales Cardiovascular: S1/S2 normal but heart sounds faint, no murmur, no rub/gallop auscultated. RRR.  Musculoskeletal: Gait normal. Symmetric and independent movement of all extremities Neurological: Normal balance/coordination. No tremor. Skin: warm, dry, intact.  Psychiatric: Normal judgment/insight. Normal mood and affect. Oriented x3.   No outpatient medications have been marked as  taking for the 03/03/21 encounter (Office Visit) with Sunnie Nielsen, DO.    No Known Allergies  Patient Active Problem List   Diagnosis Date Noted   Hamstring muscle strain, left, initial encounter 12/06/2020   Calcium pyrophosphate deposition disease (CPPD) 11/10/2020   CAD S/P percutaneous coronary angioplasty 11/25/2019   Palpitations 11/22/2019   Syncope and collapse    Essential hypertension    Chest pain of uncertain etiology    SVT (supraventricular tachycardia) (HCC) 11/20/2019   Syncope 11/20/2019   Peripheral edema 11/21/2018   Prediabetes 11/11/2018   Glucosuria 10/20/2018   Nocturia 10/18/2018   Encounter for screening for malignant neoplasm of respiratory organs 10/18/2018   Smokers' cough (HCC) 07/21/2018   Mixed hyperlipidemia 07/21/2018   Hypertension goal BP (blood pressure) < 130/80 07/21/2018   Encounter for tobacco use cessation counseling 09/22/2015   Tobacco use 09/22/2015    Family History  Problem Relation Age of Onset   Hypertension Mother     Social History   Tobacco Use  Smoking Status Former   Packs/day: 1.50   Years: 45.00   Pack years: 67.50   Types: Cigarettes   Quit date: 11/25/2019   Years since quitting: 1.2  Smokeless Tobacco Never  Tobacco Comments   as of 07/15/18 0.5 ppd    Past Surgical History:  Procedure Laterality Date   CORONARY STENT INTERVENTION N/A 11/24/2019   Procedure: CORONARY STENT INTERVENTION;  Surgeon: Tonny Bollman, MD;  Location: [facility] INVASIVE CV LAB;  Service: Cardiovascular;  Laterality: N/A;   INTRAVASCULAR ULTRASOUND/IVUS N/A 11/24/2019   Procedure: Intravascular Ultrasound/IVUS;  Surgeon: Tonny Bollman, MD;  Location: [facility] INVASIVE CV LAB;  Service: Cardiovascular;  Laterality: N/A;   LEFT HEART CATH AND CORONARY ANGIOGRAPHY N/A 11/24/2019   Procedure: LEFT HEART CATH AND CORONARY ANGIOGRAPHY;  Surgeon: Tonny Bollman, MD;  Location: [facility] INVASIVE CV LAB;  Service: Cardiovascular;  Laterality: N/A;     Immunization History  Administered Date(s) Administered   Influenza-Unspecified 06/05/2019   Janssen (J&J) SARS-COV-2 Vaccination 11/16/2019   Tdap 01/22/2015   Tetanus 01/22/2015    No results found for this or any previous visit (from the past 2160 hour(s)).  No results found.     All questions at time of visit were answered - patient instructed to contact office with any additional concerns or updates. ER/RTC precautions were reviewed with the patient as applicable.   Please note: manual typing as well as voice recognition software may have been used to produce this document - typos may escape review. Please contact Dr. Lyn Hollingshead for  any needed clarifications.

## 2021-03-03 NOTE — Patient Instructions (Addendum)
Dr Mayford Knife (cardiology) needs to see you to continue your medicines / monitor heart health follow-up. I refilled Dr Norris Cross medicines for 90 days but you MUST see her as directed to continue further refills. I sent a year's worth of the refills for medicines that I am responsible for.

## 2021-03-15 ENCOUNTER — Other Ambulatory Visit: Payer: 59

## 2021-06-07 ENCOUNTER — Other Ambulatory Visit: Payer: Self-pay | Admitting: Cardiology

## 2021-06-14 ENCOUNTER — Encounter: Payer: Self-pay | Admitting: Cardiology

## 2021-06-14 ENCOUNTER — Other Ambulatory Visit: Payer: Self-pay

## 2021-06-14 ENCOUNTER — Ambulatory Visit (INDEPENDENT_AMBULATORY_CARE_PROVIDER_SITE_OTHER): Payer: 59 | Admitting: Cardiology

## 2021-06-14 VITALS — BP 114/70 | HR 81 | Ht 65.0 in | Wt 215.2 lb

## 2021-06-14 DIAGNOSIS — I471 Supraventricular tachycardia: Secondary | ICD-10-CM | POA: Diagnosis not present

## 2021-06-14 DIAGNOSIS — R55 Syncope and collapse: Secondary | ICD-10-CM

## 2021-06-14 DIAGNOSIS — I2510 Atherosclerotic heart disease of native coronary artery without angina pectoris: Secondary | ICD-10-CM | POA: Diagnosis not present

## 2021-06-14 DIAGNOSIS — E782 Mixed hyperlipidemia: Secondary | ICD-10-CM

## 2021-06-14 DIAGNOSIS — Z9861 Coronary angioplasty status: Secondary | ICD-10-CM

## 2021-06-14 DIAGNOSIS — I10 Essential (primary) hypertension: Secondary | ICD-10-CM

## 2021-06-14 MED ORDER — CLOPIDOGREL BISULFATE 75 MG PO TABS: 75.0000 mg | ORAL_TABLET | Freq: Every day | ORAL | 3 refills | Status: DC

## 2021-06-14 NOTE — Addendum Note (Signed)
Addended by: Theresia Majors on: 06/14/2021 03:48 PM   Modules accepted: Orders

## 2021-06-14 NOTE — Progress Notes (Signed)
CARDIOLOGY OFFICE NOTE  Date:  06/14/2021    Carlos Valentine Date of Birth: 1956/06/06 Medical Record #[id number]  PCP:  Sunnie Nielsen, DO  Cardiologist:  Mayford Knife   Chief Complaint  Patient presents with   Coronary Artery Disease   Hypertension   Hyperlipidemia     History of Present Illness: Carlos Valentine is a 65 y.o. male with a history of HTN, HLD, ongoing tobacco abuse, SVT And ASCAD with cardiac CT showing coronary calcium score 442 with CAD and abnormal FFR in the pLAD - s/p cath showing diffuse non obstructive CAD in the LCX and RCA and severe LAD stenosis treated with DES with IVUS guidance. There was a small diagonal side-branch occlusion post-PCI with significant associated chest pain and ST change. Vessel < 1.5 mm in diameter not suitable for PCI.    He is here today for followup and is doing well.  He denies any chest pain or pressure, SOB, DOE, PND, orthopnea, LE edema or syncope. Occasionally he will have some palpitations but they only last a few seconds and never sustained.  He is compliant with his meds and is tolerating meds with no SE.   Unfortunately he has gained about 20lbs.    Past Medical History:  Diagnosis Date   CAD (coronary artery disease), native coronary artery    cardiac CT showing coronary calcium score 442 with CAD and abnormal FFR in the pLAD - s/p cath showing diffuse non obstructive CAD in the LCX and RCA and severe LAD stenosis treated with DES with IVUS guidance. There was a small diagonal side-branch occlusion post-PCI   Hyperlipidemia    Hypertension    Obesity    Tobacco use     Past Surgical History:  Procedure Laterality Date   CORONARY STENT INTERVENTION N/A 11/24/2019   Procedure: CORONARY STENT INTERVENTION;  Surgeon: Tonny Bollman, MD;  Location: [facility] INVASIVE CV LAB;  Service: Cardiovascular;  Laterality: N/A;   INTRAVASCULAR ULTRASOUND/IVUS N/A 11/24/2019   Procedure: Intravascular Ultrasound/IVUS;  Surgeon:  Tonny Bollman, MD;  Location: [facility] INVASIVE CV LAB;  Service: Cardiovascular;  Laterality: N/A;   LEFT HEART CATH AND CORONARY ANGIOGRAPHY N/A 11/24/2019   Procedure: LEFT HEART CATH AND CORONARY ANGIOGRAPHY;  Surgeon: Tonny Bollman, MD;  Location: [facility] INVASIVE CV LAB;  Service: Cardiovascular;  Laterality: N/A;     Medications: Current Meds  Medication Sig   acetaminophen (TYLENOL) 500 MG tablet Take 500 mg by mouth every 6 (six) hours as needed for mild pain or headache.   amLODipine (NORVASC) 5 MG tablet TAKE 1 TABLET BY MOUTH AT BEDTIME   aspirin 81 MG tablet Take 81 mg by mouth in the morning.    atorvastatin (LIPITOR) 80 MG tablet Take 1 tablet (80 mg total) by mouth daily. Please make yearly appt with Dr. Mayford Knife for June 2022 for future refills. Thank you 1st attempt   BRILINTA 90 MG TABS tablet Take 1 tablet by mouth twice daily   cyclobenzaprine (FLEXERIL) 10 MG tablet Take 0.5-1 tablets (5-10 mg total) by mouth 3 (three) times daily as needed for muscle spasms. Caution: can cause drowsiness   irbesartan (AVAPRO) 75 MG tablet Take 1 tablet (75 mg total) by mouth in the morning.   metoprolol succinate (TOPROL-XL) 50 MG 24 hr tablet Take 1 tablet (50 mg total) by mouth daily. Take with or immediately following a meal. Please make yearly appt with Dr. Mayford Knife for June 2022 for future refills. Thank you 1st attempt   naproxen (  NAPROSYN) 500 MG tablet Take 1 tablet (500 mg total) by mouth 2 (two) times daily as needed.   nitroGLYCERIN (NITROSTAT) 0.4 MG SL tablet Place 1 tablet (0.4 mg total) under the tongue every 5 (five) minutes x 3 doses as needed for chest pain.   pantoprazole (PROTONIX) 40 MG tablet Take 1 tablet (40 mg total) by mouth daily.   traZODone (DESYREL) 50 MG tablet TAKE 1/2 TO 1 (ONE-HALF TO ONE) TABLET BY MOUTH AT BEDTIME AS NEEDED FOR SLEEP     Allergies: No Known Allergies  Social History: The patient  reports that he quit smoking about 18 months ago. His smoking  use included cigarettes. He has a 67.50 pack-year smoking history. He has never used smokeless tobacco. He reports that he does not currently use drugs. He reports that he does not drink alcohol.   Family History: The patient's family history includes Hypertension in his mother.   Review of Systems: Please see the history of present illness.   All other systems are reviewed and negative.   Physical Exam: VS:  BP 114/70   Pulse 81   Ht 5\' 5"  (1.651 m)   Wt 215 lb 3.2 oz (97.6 kg)   SpO2 98%   BMI 35.81 kg/m  .  BMI Body mass index is 35.81 kg/m.  Wt Readings from Last 3 Encounters:  06/14/21 215 lb 3.2 oz (97.6 kg)  03/03/21 208 lb (94.3 kg)  12/06/20 208 lb (94.3 kg)   GEN: Well nourished, well developed in no acute distress HEENT: Normal NECK: No JVD; No carotid bruits LYMPHATICS: No lymphadenopathy CARDIAC:RRR, no murmurs, rubs, gallops RESPIRATORY:  Clear to auscultation without rales, wheezing or rhonchi  ABDOMEN: Soft, non-tender, non-distended MUSCULOSKELETAL:  No edema; No deformity  SKIN: Warm and dry NEUROLOGIC:  Alert and oriented x 3 PSYCHIATRIC:  Normal affect    LABORATORY DATA:  EKG:  EKG is not ordered today.    Lab Results  Component Value Date   WBC CANCELED 04/12/2020   HGB 14.9 12/02/2019   HCT 43.6 12/02/2019   PLT 488 (H) 12/02/2019   GLUCOSE CANCELED 04/12/2020   CHOL 118 11/21/2019   TRIG 43 11/21/2019   HDL 39 (L) 11/21/2019   LDLCALC CANCELED 04/12/2020   ALT 13 07/15/2018   AST 16 07/15/2018   NA 138 12/02/2019   K 4.3 12/02/2019   CL 100 12/02/2019   CREATININE 0.81 12/02/2019   BUN 16 12/02/2019   CO2 22 12/02/2019   TSH CANCELED 04/12/2020   PSA 1.6 11/11/2018   HGBA1C 6.4 (H) 11/21/2019     BNP (last 3 results) No results for input(s): BNP in the last 8760 hours.  ProBNP (last 3 results) No results for input(s): PROBNP in the last 8760 hours.   Other Studies Reviewed Today:  Echocardiogram 11/21/19:    1. Left  ventricular ejection fraction, by estimation, is 55 to 60%. The  left ventricle has normal function. The left ventricle grossly normal  regional wall motion. Left ventricular diastolic parameters are consistent  with Grade I diastolic dysfunction  (impaired relaxation).   2. Right ventricular systolic function is normal. The right ventricular  size is normal. Tricuspid regurgitation signal is inadequate for assessing  PA pressure.   3. The mitral valve is normal in structure. No evidence of mitral valve  regurgitation. No evidence of mitral stenosis.   4. The aortic valve was not well visualized. Aortic valve regurgitation  is trivial. No aortic stenosis is present.  5. Aortic dilatation noted. There is mild dilatation of the aortic root  (41 mm) and of the ascending aorta measuring 43 mm.   6. The inferior vena cava is normal in size with greater than 50%  respiratory variability, suggesting right atrial pressure of 3 mmHg.  _____________   LHC 11/24/19:   1. Diffuse nonobstructive CAD in the left circumflex and RCA with associated ectasia in the RCA 2. Severe proximal LAD stenosis correlating with CT-FFR positive lesion, treated with a 4.0x15 mm Resolute Onyx DES using IVUS guidance 3. Known normal LV systolic function by noninvasive assessment   Small diagonal side-branch occlusion post-PCI with significant associated chest pain and ST change. Vessel < 1.5 mm in diameter not suitable for PCI.   Recommend aggressive medical therapy for secondary risk reduction.    Assessment/Plan:  1. CAD  - s/p PCI to LAD -he has stopped smoking -he has not had any anginal sx since I saw him last -continue on ASA 81mg  daily, statin and BB. -change Brilinta to Plavix 75mg  daily  2. SVT  -he is maintaining NSR and denies any further palpitations -seen by EP and recommended continuing BB unless he has further SVT and if so then ablation -continue Toprol  3. Syncope  -felt to be due to SVT  and decreased cerebral perfusion in the setting of SVT at 200 bpm.  -no further dizzy spells or syncope  4. HTN  -BP is adequately controlled on exam today -continue prescription drug management with toprol XL 50mg  daily, Amlodipine 5mg  daily and Irbesartan 75mg  daily>refilled  5. HLD  -LDL goal < 70 -check FLP and ALT -continue prescription drug management with Atorvastatin 80mg  daily>refilled  Current medicines are reviewed with the patient today.  The patient does not have concerns regarding medicines other than what has been noted above.  The following changes have been made:  See above.  Labs/ tests ordered today include:    No orders of the defined types were placed in this encounter.   Signed: , MD  06/14/2021 3:34 PM  [facility] [address] [place],  [place] Phone: [phone] Fax: [phone]

## 2021-06-14 NOTE — Patient Instructions (Signed)
Medication Instructions:  Your physician has recommended you make the following change in your medication: 1) STOP taking Brilinta  2) START taking Plavix 75 mg daily  *If you need a refill on your cardiac medications before your next appointment, please call your pharmacy*   Lab Work: TODAY: FLP and ALT If you have labs (blood work) drawn today and your tests are completely normal, you will receive your results only by: MyChart Message (if you have MyChart) OR A paper copy in the mail If you have any lab test that is abnormal or we need to change your treatment, we will call you to review the results.   Follow-Up: At [facility], you and your health needs are our priority.  As part of our continuing mission to provide you with exceptional heart care, we have created designated Provider Care Teams.  These Care Teams include your primary Cardiologist (physician) and Advanced Practice Providers (APPs -  Physician Assistants and Nurse Practitioners) who all work together to provide you with the care you need, when you need it.  Your next appointment:   1 year(s)  The format for your next appointment:   In Person  Provider:   You may see [place] Magic, MD or one of the following Advanced Practice Providers on your designated Care Team:   Ronie Spies, PA-C Jacolyn Reedy, PA-C

## 2021-07-04 ENCOUNTER — Other Ambulatory Visit: Payer: Self-pay | Admitting: Osteopathic Medicine

## 2021-08-29 ENCOUNTER — Other Ambulatory Visit: Payer: Self-pay | Admitting: Osteopathic Medicine

## 2021-08-29 DIAGNOSIS — I10 Essential (primary) hypertension: Secondary | ICD-10-CM

## 2021-12-01 ENCOUNTER — Other Ambulatory Visit: Payer: Self-pay | Admitting: Family Medicine

## 2021-12-28 ENCOUNTER — Other Ambulatory Visit: Payer: Self-pay | Admitting: Medical-Surgical

## 2022-01-31 ENCOUNTER — Telehealth: Payer: Self-pay

## 2022-01-31 MED ORDER — ATORVASTATIN CALCIUM 80 MG PO TABS: 80.0000 mg | ORAL_TABLET | Freq: Every day | ORAL | 0 refills | Status: DC

## 2022-01-31 NOTE — Telephone Encounter (Signed)
Pt came in requesting refill of atorvastatin.  Refill for 90 day supply sent to pharmacy but pt MUST keep his next appt in order to receive future refills.  Charyl Bigger, CMA

## 2022-03-11 ENCOUNTER — Other Ambulatory Visit: Payer: Self-pay | Admitting: Family Medicine

## 2022-03-11 ENCOUNTER — Other Ambulatory Visit: Payer: Self-pay | Admitting: Osteopathic Medicine

## 2022-03-11 DIAGNOSIS — I10 Essential (primary) hypertension: Secondary | ICD-10-CM

## 2022-04-07 ENCOUNTER — Ambulatory Visit (INDEPENDENT_AMBULATORY_CARE_PROVIDER_SITE_OTHER): Payer: PRIVATE HEALTH INSURANCE | Admitting: Medical-Surgical

## 2022-04-07 ENCOUNTER — Encounter: Payer: Self-pay | Admitting: Medical-Surgical

## 2022-04-07 VITALS — BP 114/72 | HR 84 | Resp 20 | Ht 65.0 in | Wt 221.9 lb

## 2022-04-07 DIAGNOSIS — Z7689 Persons encountering health services in other specified circumstances: Secondary | ICD-10-CM

## 2022-04-07 DIAGNOSIS — L989 Disorder of the skin and subcutaneous tissue, unspecified: Secondary | ICD-10-CM

## 2022-04-07 DIAGNOSIS — B360 Pityriasis versicolor: Secondary | ICD-10-CM

## 2022-04-07 DIAGNOSIS — F5101 Primary insomnia: Secondary | ICD-10-CM

## 2022-04-07 DIAGNOSIS — K219 Gastro-esophageal reflux disease without esophagitis: Secondary | ICD-10-CM | POA: Diagnosis not present

## 2022-04-07 DIAGNOSIS — E782 Mixed hyperlipidemia: Secondary | ICD-10-CM | POA: Diagnosis not present

## 2022-04-07 DIAGNOSIS — I10 Essential (primary) hypertension: Secondary | ICD-10-CM

## 2022-04-07 MED ORDER — IRBESARTAN 75 MG PO TABS: 75.0000 mg | ORAL_TABLET | Freq: Every morning | ORAL | 1 refills | Status: DC

## 2022-04-07 MED ORDER — AMLODIPINE BESYLATE 5 MG PO TABS: ORAL_TABLET | ORAL | 1 refills | Status: DC

## 2022-04-07 MED ORDER — TRAZODONE HCL 50 MG PO TABS
ORAL_TABLET | ORAL | 3 refills | Status: DC
Start: 2022-04-07 — End: 2023-11-05

## 2022-04-07 MED ORDER — PANTOPRAZOLE SODIUM 40 MG PO TBEC: 40.0000 mg | DELAYED_RELEASE_TABLET | Freq: Every day | ORAL | 1 refills | Status: DC

## 2022-04-07 MED ORDER — METOPROLOL SUCCINATE ER 50 MG PO TB24
50.0000 mg | ORAL_TABLET | Freq: Every day | ORAL | 1 refills | Status: DC
Start: 2022-04-07 — End: 2022-10-02

## 2022-04-07 MED ORDER — ATORVASTATIN CALCIUM 80 MG PO TABS: 80.0000 mg | ORAL_TABLET | Freq: Every day | ORAL | 1 refills | Status: DC

## 2022-04-07 MED ORDER — KETOCONAZOLE 2 % EX SHAM: MEDICATED_SHAMPOO | CUTANEOUS | 0 refills | Status: DC

## 2022-04-07 NOTE — Progress Notes (Signed)
Established Patient Office Visit  Subjective   Patient ID: Carlos Valentine, male   DOB: May 27, 1956 Age: 66 y.o. MRN: [id number]   Chief Complaint  Patient presents with   Transitions Of Care    HPI Pleasant 66 year old male presenting today to transfer care to a new PCP and for the following:  HTN: Followed by cardiology.  He has not been to see them since October of last year so he is due for an appointment.  Notes that they change his medicine at his last appointment from Brilinta to Plavix.  Since then, he has been having more dizzy episodes than he did previously.  He has not reached out to them for recommendations or further management.  He is taking his blood pressure medications as prescribed, tolerating well without side effects.  He does check his blood pressure at home regularly and keeps a log.  He is not able to provide specific numbers today but reports that his blood pressure readings are always good.  He is not currently following any kind of exercise program and notes that since he quit smoking 2 years ago, he has gained a lot of weight.  Denies CP, SOB, palpitations, lower extremity edema, headaches, or vision changes.  Hyperlipidemia: He is taking Lipitor 80 mg daily, tolerating well without side effects.  Following a low-fat diet.  GERD: Taking Protonix 40 mg daily, tolerating well without side effects.  Achieves good reflux management with this medication.  Insomnia: Taking trazodone 50 mg nightly, tolerating well without side effects or excessive a.m. grogginess.  Reports that the medication helps him fall asleep easily however he does wake several times at night to void.  Reports that he drinks a lot of water and stops drinking fluids around 8 PM with his bedtime being around 8:30 PM.  Scalp lesion: Reports hitting the crown of his head on a piece of metal approximately 8 months ago.  It did bleed a lot but then finally scabbed over.  Since then it has never fully  healed and now he has a lesion on the top of his head that is somewhat bothersome.  Rash: Reports that in the summertime he gets a rash all over his upper body, back, and torso.  It is not itchy but it is cosmetically bothersome and he is interested in treatment options.   Objective:    Vitals:   04/07/22 1511  BP: 114/72  Pulse: 84  Resp: 20  Height: 5\' 5"  (1.651 m)  Weight: 221 lb 14.4 oz (100.7 kg)  SpO2: 95%  BMI (Calculated): 36.93   Physical Exam Vitals reviewed.  Constitutional:      General: He is not in acute distress.    Appearance: Normal appearance. He is obese. He is not ill-appearing.  HENT:     Head: Normocephalic.  Cardiovascular:     Rate and Rhythm: Normal rate.     Pulses: Normal pulses.     Heart sounds: Normal heart sounds. No murmur heard.    No friction rub. No gallop.  Pulmonary:     Effort: Pulmonary effort is normal. No respiratory distress.     Breath sounds: Normal breath sounds.  Skin:    General: Skin is warm and dry.     Comments: See clinical photo under media tab.  Neurological:     Mental Status: He is alert and oriented to person, place, and time.  Psychiatric:        Mood and Affect: Mood normal.  Behavior: Behavior normal.        Thought Content: Thought content normal.        Judgment: Judgment normal.   No results found for this or any previous visit (from the past 24 hour(s)).     The ASCVD Risk score (Arnett DK, et al., 2019) failed to calculate for the following reasons:   The valid total cholesterol range is 130 to 320 mg/dL   Assessment & Plan:   1. Encounter to establish care Reviewed available information and discussed care concerns with patient.   2. Hypertension goal BP (blood pressure) < 130/80 Managed by cardiology.  Refilling medications per patient request.  Advised him to contact his cardiologist to schedule his appointment that is due in early October.  Also advised him to contact them regarding the  worsened dizziness with Plavix since they may to need to make medication adjustments to limit these side effects. - irbesartan (AVAPRO) 75 MG tablet; Take 1 tablet (75 mg total) by mouth every morning. SIN RECAMBIOS. NECESITA UNA CITA CON UN NUEVO PROVEEDOR  Dispense: 90 tablet; Refill: 1 - amLODipine (NORVASC) 5 MG tablet; TAKE 1 TABLET BY MOUTH AT BEDTIME. SIN RECAMBIOS. NECESITA UNA CITA CON UN NUEVO PROVEEDOR  Dispense: 90 tablet; Refill: 1  3. Mixed hyperlipidemia Continue atorvastatin 80 mg daily.  4. Gastroesophageal reflux disease, unspecified whether esophagitis present Continue Protonix 40 mg daily.  5. Primary insomnia Continue trazodone 50 mg nightly as needed for sleep.  Discussed limiting fluids for several hours before bed to limit episodes of nocturia.  6. Lesion of skin of scalp Consider seborrheic keratosis versus actinic keratosis versus wart.  Discussed options for evaluation and removal.  Advised that he may have a's box on his scalp when the lesion is removed that no longer produces hair but this usually is minimal.  He would like to hold off for today but will let us know in the future if he would like to proceed with lesion removal.  7. Tinea versicolor Presentation consistent with tinea versicolor.  Light green fluorescence under [employer] evaluation.  Ketoconazole shampoo ordered to apply to damp skin that is affected, leave on for 5 minutes, then rinse off.  Advised to avoid excessive sun exposure and monitor for worsening or failure to improve.  Return in about 6 months (around 10/08/2022) for chronic disease follow up; call cardiology for an appointment asap.  ___________________________________________ Thayer Ohm, DNP, APRN, FNP-BC Primary Care and Sports Medicine [facility]

## 2022-05-01 ENCOUNTER — Other Ambulatory Visit: Payer: Self-pay | Admitting: Medical-Surgical

## 2022-05-22 ENCOUNTER — Telehealth: Payer: Self-pay | Admitting: *Deleted

## 2022-05-22 NOTE — Telephone Encounter (Signed)
   Pre-operative Risk Assessment    Patient Name: Carlos Valentine  DOB: August 17, 1956 MRN: [id number]     Request for Surgical Clearance    Procedure:  Dental Extraction - Amount of Teeth to be Pulled:  REMAINING TEETH LESS THAN 10 TEETH TOTAL; (NON-RESTORABLE DENTITION IN BOTH ARCHES)  Date of Surgery:  Clearance TBD                                 Surgeon:  DR. Madelin Rear, DDS Surgeon's Group or Practice Name:  [facility] Phone number:  NOT LISTED ON CLEARANCE REQUEST Fax number:  [phone]   Type of Clearance Requested:   - Medical  - Pharmacy:  Hold Aspirin and Clopidogrel (Plavix) ; DOES PT NEED SBE AS WELL?  ALSO NOTED ON CLEARANCE IS BLEEDING: MINIMAL (<50ML)   Type of Anesthesia:  Local ; WITH 4% ARTICAINE 1:100,000 EPINEPHRINE WILL BE UTILIZED, NEVER TO EXCEED 0.2 MG EPINEPHRINE TOTAL    Additional requests/questions:    [name]   05/22/2022, 5:50 PM

## 2022-05-23 NOTE — Telephone Encounter (Signed)
Spoke with Juliann Pulse at [facility], she confirms pt is having 8 teeth extracted.

## 2022-05-26 NOTE — Telephone Encounter (Signed)
Left message to call back for tele pre op appt 

## 2022-05-26 NOTE — Telephone Encounter (Signed)
   Name: Firmin Belisle  DOB: Sep 07, 1955  MRN: [id number]  Primary Cardiologist: Fransico Him, MD  Chart reviewed as part of pre-operative protocol coverage. Because of Nayef Wehrenberg's past medical history and time since last visit, he will require a follow-up telephone visit in order to better assess preoperative cardiovascular risk.  Pre-op covering staff: - Please schedule appointment and call patient to inform them. If patient already had an upcoming appointment within acceptable timeframe, please add "pre-op clearance" to the appointment notes so provider is aware. - Please contact requesting surgeon's office via preferred method (i.e, phone, fax) to inform them of need for appointment prior to surgery.  This message will also be routed to  Dr Radford Pax for input on holding plavix/asa as requested below so that this information is available to the clearing provider at time of patient's appointment.   Elgie Collard, PA-C  05/26/2022, 8:29 AM

## 2022-05-29 NOTE — Telephone Encounter (Signed)
Left message x 2 to call back for tele pre op appt.  

## 2022-05-30 ENCOUNTER — Encounter: Payer: Self-pay | Admitting: *Deleted

## 2022-05-30 NOTE — Telephone Encounter (Signed)
Our office has attempted x 3 to reach pt to schedule a tele pre op appt. I will send letter to the pt to call back for a tele pre op appt.   Will update the requesting office as well. Will remove from the pre op call back pool at this time.

## 2022-06-01 ENCOUNTER — Other Ambulatory Visit: Payer: Self-pay | Admitting: Medical-Surgical

## 2022-06-14 ENCOUNTER — Other Ambulatory Visit: Payer: Self-pay | Admitting: Cardiology

## 2022-07-12 ENCOUNTER — Other Ambulatory Visit: Payer: Self-pay | Admitting: Cardiology

## 2022-07-30 ENCOUNTER — Other Ambulatory Visit: Payer: Self-pay | Admitting: Cardiology

## 2022-08-13 ENCOUNTER — Other Ambulatory Visit: Payer: Self-pay | Admitting: Cardiology

## 2022-08-14 MED ORDER — CLOPIDOGREL BISULFATE 75 MG PO TABS: ORAL_TABLET | ORAL | 0 refills | Status: DC

## 2022-08-14 NOTE — Addendum Note (Signed)
Addended by: Burnetta Sabin on: 08/14/2022 02:00 PM   Modules accepted: Orders

## 2022-08-30 ENCOUNTER — Other Ambulatory Visit: Payer: Self-pay | Admitting: Cardiology

## 2022-10-01 ENCOUNTER — Other Ambulatory Visit: Payer: Self-pay | Admitting: Medical-Surgical

## 2022-10-02 ENCOUNTER — Other Ambulatory Visit: Payer: Self-pay | Admitting: Medical-Surgical

## 2022-10-02 DIAGNOSIS — I10 Essential (primary) hypertension: Secondary | ICD-10-CM

## 2022-10-02 MED ORDER — PANTOPRAZOLE SODIUM 40 MG PO TBEC: 40.0000 mg | DELAYED_RELEASE_TABLET | Freq: Every day | ORAL | 0 refills | Status: DC

## 2022-10-09 ENCOUNTER — Other Ambulatory Visit: Payer: Self-pay | Admitting: Family Medicine

## 2022-10-09 ENCOUNTER — Other Ambulatory Visit: Payer: Self-pay | Admitting: Medical-Surgical

## 2022-10-09 ENCOUNTER — Other Ambulatory Visit: Payer: Self-pay | Admitting: Cardiology

## 2022-10-09 DIAGNOSIS — I10 Essential (primary) hypertension: Secondary | ICD-10-CM

## 2022-10-24 ENCOUNTER — Other Ambulatory Visit: Payer: Self-pay | Admitting: Medical-Surgical

## 2022-12-21 ENCOUNTER — Encounter: Payer: Self-pay | Admitting: *Deleted

## 2022-12-29 ENCOUNTER — Ambulatory Visit: Payer: PRIVATE HEALTH INSURANCE | Attending: Cardiology | Admitting: Cardiology

## 2022-12-29 ENCOUNTER — Encounter: Payer: Self-pay | Admitting: Cardiology

## 2022-12-29 VITALS — BP 134/72 | HR 78 | Ht 66.0 in | Wt 218.0 lb

## 2022-12-29 DIAGNOSIS — Z79899 Other long term (current) drug therapy: Secondary | ICD-10-CM

## 2022-12-29 DIAGNOSIS — R55 Syncope and collapse: Secondary | ICD-10-CM | POA: Diagnosis not present

## 2022-12-29 DIAGNOSIS — I10 Essential (primary) hypertension: Secondary | ICD-10-CM

## 2022-12-29 DIAGNOSIS — Z9861 Coronary angioplasty status: Secondary | ICD-10-CM

## 2022-12-29 DIAGNOSIS — I471 Supraventricular tachycardia, unspecified: Secondary | ICD-10-CM | POA: Diagnosis not present

## 2022-12-29 DIAGNOSIS — I2510 Atherosclerotic heart disease of native coronary artery without angina pectoris: Secondary | ICD-10-CM

## 2022-12-29 DIAGNOSIS — E782 Mixed hyperlipidemia: Secondary | ICD-10-CM

## 2022-12-29 DIAGNOSIS — R29898 Other symptoms and signs involving the musculoskeletal system: Secondary | ICD-10-CM

## 2022-12-29 NOTE — Patient Instructions (Signed)
Medication Instructions:  Your physician recommends that you continue on your current medications as directed. Please refer to the Current Medication list given to you today.  *If you need a refill on your cardiac medications before your next appointment, please call your pharmacy*   Lab Work: Please complete a FASTING lipid panel and ALT in our lab today before you leave.  If you have labs (blood work) drawn today and your tests are completely normal, you will receive your results only by: MyChart Message (if you have MyChart) OR A paper copy in the mail If you have any lab test that is abnormal or we need to change your treatment, we will call you to review the results.   Testing/Procedures: Your physician has requested that you have an ankle brachial index (ABI). During this test an ultrasound and blood pressure cuff are used to evaluate the arteries that supply the arms and legs with blood. Allow thirty minutes for this exam. There are no restrictions or special instructions.    Follow-Up: At [facility], you and your health needs are our priority.  As part of our continuing mission to provide you with exceptional heart care, we have created designated Provider Care Teams.  These Care Teams include your primary Cardiologist (physician) and Advanced Practice Providers (APPs -  Physician Assistants and Nurse Practitioners) who all work together to provide you with the care you need, when you need it.  We recommend signing up for the patient portal called "MyChart".  Sign up information is provided on this After Visit Summary.  MyChart is used to connect with patients for Virtual Visits (Telemedicine).  Patients are able to view lab/test results, encounter notes, upcoming appointments, etc.  Non-urgent messages can be sent to your provider as well.   To learn more about what you can do with MyChart, go to [email or web address].    Your next appointment:   1  year(s)  Provider:   Armanda Magic, MD

## 2022-12-29 NOTE — Addendum Note (Signed)
Addended by: Luellen Pucker on: 12/29/2022 04:14 PM   Modules accepted: Orders

## 2022-12-29 NOTE — Progress Notes (Addendum)
CARDIOLOGY OFFICE NOTE  Date:  12/29/2022    Carlos Valentine Date of Birth: 10/08/1955 Medical Record #[id number]  PCP:  Christen Butter, NP  Cardiologist:  Mayford Knife   Chief Complaint  Patient presents with   Coronary Artery Disease   Hypertension   Hyperlipidemia     History of Present Illness: Carlos Valentine is a 67 y.o. male with a history of HTN, HLD, ongoing tobacco abuse, SVT And ASCAD with cardiac CT showing coronary calcium score 442 with CAD and abnormal FFR in the pLAD - s/p cath showing diffuse non obstructive CAD in the LCX and RCA and severe LAD stenosis treated with DES with IVUS guidance. There was a small diagonal side-branch occlusion post-PCI with significant associated chest pain and ST change. Vessel < 1.5 mm in diameter not suitable for PCI.    He  is here today for followup and is doing well.  He tells me that he has had problems with heaviness in his legs when he stands up for too long.  No cramps when walking. He denies any chest pain or pressure, SOB, DOE, PND, orthopnea, LE edema, dizziness, palpitations or syncope. He is compliant with his meds and is tolerating meds with no SE.    Past Medical History:  Diagnosis Date   CAD (coronary artery disease), native coronary artery    cardiac CT showing coronary calcium score 442 with CAD and abnormal FFR in the pLAD - s/p cath showing diffuse non obstructive CAD in the LCX and RCA and severe LAD stenosis treated with DES with IVUS guidance. There was a small diagonal side-branch occlusion post-PCI   Hyperlipidemia    Hypertension    Obesity    Tobacco use     Past Surgical History:  Procedure Laterality Date   CORONARY STENT INTERVENTION N/A 11/24/2019   Procedure: CORONARY STENT INTERVENTION;  Surgeon: Tonny Bollman, MD;  Location: [facility] INVASIVE CV LAB;  Service: Cardiovascular;  Laterality: N/A;   CORONARY ULTRASOUND/IVUS N/A 11/24/2019   Procedure: Intravascular Ultrasound/IVUS;  Surgeon: Tonny Bollman, MD;  Location: [facility] INVASIVE CV LAB;  Service: Cardiovascular;  Laterality: N/A;   LEFT HEART CATH AND CORONARY ANGIOGRAPHY N/A 11/24/2019   Procedure: LEFT HEART CATH AND CORONARY ANGIOGRAPHY;  Surgeon: Tonny Bollman, MD;  Location: [facility] INVASIVE CV LAB;  Service: Cardiovascular;  Laterality: N/A;     Medications: Current Meds  Medication Sig   acetaminophen (TYLENOL) 500 MG tablet Take 500 mg by mouth every 6 (six) hours as needed for mild pain or headache.   amLODipine (NORVASC) 5 MG tablet TAKE 1 TABLET BY MOUTH AT BEDTIME   aspirin 81 MG tablet Take 81 mg by mouth in the morning.    atorvastatin (LIPITOR) 80 MG tablet Take 1 tablet (80 mg total) by mouth daily. NO REFILLS. NEEDS A BP CHECK & F/U W/PCP   clopidogrel (PLAVIX) 75 MG tablet TAKE  TABLET BY MOUTH ONCE DAILY   irbesartan (AVAPRO) 75 MG tablet TAKE 1 TABLET BY MOUTH IN THE MORNING   ketoconazole (NIZORAL) 2 % shampoo APPLY TO THE DAMP SKIN OF THE AFFECTED AREA. LEAVE ON 5 MINUTES THEN RINSE   metoprolol succinate (TOPROL-XL) 50 MG 24 hr tablet TAKE 1 TABLET BY MOUTH ONCE DAILY. TAKE WITH OR IMMEDIATELY FOLLOWING A MEAL   nitroGLYCERIN (NITROSTAT) 0.4 MG SL tablet Place 1 tablet (0.4 mg total) under the tongue every 5 (five) minutes x 3 doses as needed for chest pain.   pantoprazole (PROTONIX) 40 MG tablet  Take 1 tablet (40 mg total) by mouth daily. NEEDS APPOINTMENT FOR FURTHER REFILLS.   traZODone (DESYREL) 50 MG tablet TAKE 1/2 TO 1 (ONE-HALF TO ONE) TABLET BY MOUTH AT BEDTIME AS NEEDED FOR SLEEP     Allergies: No Known Allergies  Social History: The patient  reports that he quit smoking about 3 years ago. His smoking use included cigarettes. He has a 67.50 pack-year smoking history. He has never used smokeless tobacco. He reports that he does not currently use drugs. He reports that he does not drink alcohol.   Family History: The patient's family history includes Hypertension in his mother.   Review of  Systems: Please see the history of present illness.   All other systems are reviewed and negative.   Physical Exam: VS:  BP 134/72   Pulse 78   Ht 5\' 6"  (1.676 m)   Wt 218 lb (98.9 kg)   SpO2 94%   BMI 35.19 kg/m  .  BMI Body mass index is 35.19 kg/m.  Wt Readings from Last 3 Encounters:  12/29/22 218 lb (98.9 kg)  04/07/22 221 lb 14.4 oz (100.7 kg)  06/14/21 215 lb 3.2 oz (97.6 kg)   GEN: Well nourished, well developed in no acute distress HEENT: Normal NECK: No JVD; No carotid bruits LYMPHATICS: No lymphadenopathy CARDIAC:RRR, no murmurs, rubs, gallops RESPIRATORY:  Clear to auscultation without rales, wheezing or rhonchi  ABDOMEN: Soft, non-tender, non-distended MUSCULOSKELETAL:  No edema; No deformity  SKIN: Warm and dry NEUROLOGIC:  Alert and oriented x 3 PSYCHIATRIC:  Normal affect   LABORATORY DATA:  EKG:  EKG ordered today and demonstrates NSR with PACs and sinus arrhythmias with iRBBB  Lab Results  Component Value Date   WBC CANCELED 04/12/2020   HGB 14.9 12/02/2019   HCT 43.6 12/02/2019   PLT 488 (H) 12/02/2019   GLUCOSE CANCELED 04/12/2020   CHOL 118 11/21/2019   TRIG 43 11/21/2019   HDL 39 (L) 11/21/2019   LDLCALC CANCELED 04/12/2020   ALT 13 07/15/2018   AST 16 07/15/2018   NA 138 12/02/2019   K 4.3 12/02/2019   CL 100 12/02/2019   CREATININE 0.81 12/02/2019   BUN 16 12/02/2019   CO2 22 12/02/2019   TSH CANCELED 04/12/2020   PSA 1.6 11/11/2018   HGBA1C 6.4 (H) 11/21/2019     BNP (last 3 results) No results for input(s): "BNP" in the last 8760 hours.  ProBNP (last 3 results) No results for input(s): "PROBNP" in the last 8760 hours.   Other Studies Reviewed Today:  Echocardiogram 11/21/19:    1. Left ventricular ejection fraction, by estimation, is 55 to 60%. The  left ventricle has normal function. The left ventricle grossly normal  regional wall motion. Left ventricular diastolic parameters are consistent  with Grade I diastolic  dysfunction  (impaired relaxation).   2. Right ventricular systolic function is normal. The right ventricular  size is normal. Tricuspid regurgitation signal is inadequate for assessing  PA pressure.   3. The mitral valve is normal in structure. No evidence of mitral valve  regurgitation. No evidence of mitral stenosis.   4. The aortic valve was not well visualized. Aortic valve regurgitation  is trivial. No aortic stenosis is present.   5. Aortic dilatation noted. There is mild dilatation of the aortic root  (41 mm) and of the ascending aorta measuring 43 mm.   6. The inferior vena cava is normal in size with greater than 50%  respiratory variability, suggesting right  atrial pressure of 3 mmHg.  _____________   LHC 11/24/19:   1. Diffuse nonobstructive CAD in the left circumflex and RCA with associated ectasia in the RCA 2. Severe proximal LAD stenosis correlating with CT-FFR positive lesion, treated with a 4.0x15 mm Resolute Onyx DES using IVUS guidance 3. Known normal LV systolic function by noninvasive assessment   Small diagonal side-branch occlusion post-PCI with significant associated chest pain and ST change. Vessel < 1.5 mm in diameter not suitable for PCI.   Recommend aggressive medical therapy for secondary risk reduction.    Assessment/Plan:  1. CAD  -cardiac CT showing coronary calcium score 442 with CAD and abnormal FFR in the pLAD  -s/p cath showing diffuse non obstructive CAD in the LCX and RCA and severe LAD stenosis treated with DES with IVUS guidance. There was a small diagonal side-branch occlusion post-PCI with significant associated chest pain and ST change. Vessel < 1.5 mm in diameter not suitable for PCI.   -he has stopped smoking -He denies any anginal symptoms -Continue drug management with Plavix 75 mg daily, Toprol-XL 50 mg daily and aspirin 81 mg daily as well as atorvastatin 80 mg daily with as needed refills  2. SVT  -He remains in normal rhythm today  and denies any palpitations -seen by EP and recommended continuing BB unless he has further SVT and if so then ablation -Continue prescription drug management with Toprol-XL 50 mg daily with as needed refills  3. Syncope  -felt to be due to SVT and decreased cerebral perfusion in the setting of SVT at 200 bpm.  -He denies any other episodes of dizzy spells or syncope  4. HTN  -BP is well-controlled on exam today -Continue prescription drug management with amlodipine 5 mg daily, Toprol-XL 50 mg daily and irbesartan 75 mg daily with as needed refills -Check bmet  5. HLD  -LDL goal < 70 -Check FLP and ALT -Continue prescription drug management with atorvastatin 80 mg daily with as needed refills  6.  Leg heaviness -this occurs when he stands too long but does not have calf pain with ambulation -will check LE ABIs  Current medicines are reviewed with the patient today.  The patient does not have concerns regarding medicines other than what has been noted above.  The following changes have been made:  See above.  Labs/ tests ordered today include:    Orders Placed This Encounter  Procedures   EKG 12-Lead     Signed: Armanda Magic, MD  12/29/2022 4:00 PM  [facility] [address] [place], [place]  [address] Phone: [phone] Fax: [phone]

## 2022-12-29 NOTE — Addendum Note (Signed)
Addended by: Luellen Pucker on: 12/29/2022 04:08 PM   Modules accepted: Orders

## 2022-12-29 NOTE — Addendum Note (Signed)
Addended by: Luellen Pucker on: 12/29/2022 04:11 PM   Modules accepted: Orders

## 2022-12-29 NOTE — Addendum Note (Signed)
Addended by: Luellen Pucker on: 12/29/2022 04:17 PM   Modules accepted: Orders

## 2022-12-30 LAB — LIPID PANEL
Chol/HDL Ratio: 2.6 ratio (ref 0.0–5.0)
Cholesterol, Total: 150 mg/dL (ref 100–199)
HDL: 58 mg/dL (ref >39)
LDL Chol Calc (NIH): 76 mg/dL (ref 0–99)
Triglycerides: 82 mg/dL (ref 0–149)
VLDL Cholesterol Cal: 16 mg/dL (ref 5–40)

## 2022-12-30 LAB — ALT: ALT: 14 IU/L (ref 0–44)

## 2023-01-02 ENCOUNTER — Other Ambulatory Visit: Payer: Self-pay | Admitting: Medical-Surgical

## 2023-01-02 DIAGNOSIS — I10 Essential (primary) hypertension: Secondary | ICD-10-CM

## 2023-01-03 NOTE — Addendum Note (Signed)
Addended by: Luellen Pucker on: 01/03/2023 01:39 PM   Modules accepted: Orders

## 2023-01-04 ENCOUNTER — Telehealth: Payer: Self-pay

## 2023-01-04 NOTE — Telephone Encounter (Signed)
Left message for patient to call office.  

## 2023-01-04 NOTE — Telephone Encounter (Signed)
-----   Message from Quintella Reichert, MD sent at 12/31/2022  7:36 PM EDT ----- LDL not at goal - please find out if he has missed any doses of atorvastatin and if no then add Zetia 10mg  daily with FLP and ALT in 8 weeks

## 2023-01-08 ENCOUNTER — Telehealth: Payer: Self-pay

## 2023-01-08 ENCOUNTER — Other Ambulatory Visit: Payer: Self-pay

## 2023-01-08 DIAGNOSIS — Z79899 Other long term (current) drug therapy: Secondary | ICD-10-CM

## 2023-01-08 DIAGNOSIS — E782 Mixed hyperlipidemia: Secondary | ICD-10-CM

## 2023-01-08 MED ORDER — EZETIMIBE 10 MG PO TABS
10.0000 mg | ORAL_TABLET | Freq: Every day | ORAL | 3 refills | Status: DC
Start: 2023-01-08 — End: 2024-03-10

## 2023-01-08 MED ORDER — EZETIMIBE 10 MG PO TABS
10.0000 mg | ORAL_TABLET | Freq: Every day | ORAL | 3 refills | Status: DC
Start: 2023-01-08 — End: 2023-01-08

## 2023-01-08 NOTE — Telephone Encounter (Signed)
Asked patient if he had been fasting for his most recent labs. Patient states he was. Discussed Dr. Norris Cross recommendation that he start zetia to get him closer to LDL goal of <70 to decrease cardiac risks. Patient verbalizes understanding. Orders placed, labs scheduled.

## 2023-01-08 NOTE — Progress Notes (Signed)
Patient requesting to do labs in [facility]. Gave patient address for [facility] at [address] in [place]. Lab orders printed out for signature, letter printed advising patient to take lab orders to lab first week of July to complete labs.

## 2023-01-08 NOTE — Telephone Encounter (Signed)
-----   Message from Traci R Turner, MD sent at 12/31/2022  7:36 PM EDT ----- LDL not at goal - please find out if he has missed any doses of atorvastatin and if no then add Zetia 10mg daily with FLP and ALT in 8 weeks 

## 2023-01-08 NOTE — Addendum Note (Signed)
Addended by: Luellen Pucker on: 01/08/2023 04:26 PM   Modules accepted: Orders

## 2023-01-11 ENCOUNTER — Encounter (HOSPITAL_COMMUNITY): Payer: Self-pay

## 2023-01-11 ENCOUNTER — Ambulatory Visit (HOSPITAL_COMMUNITY): Admission: RE | Admit: 2023-01-11 | Payer: PRIVATE HEALTH INSURANCE | Source: Ambulatory Visit

## 2023-01-12 ENCOUNTER — Other Ambulatory Visit: Payer: Self-pay | Admitting: Medical-Surgical

## 2023-01-12 NOTE — Telephone Encounter (Signed)
Pt called.  He is requesting a refill on Metoprolol 50 mg-[facility] [address] Appointment scheduled for May 23.

## 2023-01-18 ENCOUNTER — Ambulatory Visit: Payer: PRIVATE HEALTH INSURANCE | Admitting: Medical-Surgical

## 2023-01-25 ENCOUNTER — Ambulatory Visit: Payer: PRIVATE HEALTH INSURANCE | Admitting: Medical-Surgical

## 2023-02-02 ENCOUNTER — Ambulatory Visit (INDEPENDENT_AMBULATORY_CARE_PROVIDER_SITE_OTHER): Payer: PRIVATE HEALTH INSURANCE | Admitting: Medical-Surgical

## 2023-02-02 ENCOUNTER — Encounter: Payer: Self-pay | Admitting: Medical-Surgical

## 2023-02-02 VITALS — BP 125/69 | HR 74 | Resp 20 | Ht 66.0 in | Wt 221.1 lb

## 2023-02-02 DIAGNOSIS — I10 Essential (primary) hypertension: Secondary | ICD-10-CM

## 2023-02-02 DIAGNOSIS — Z1211 Encounter for screening for malignant neoplasm of colon: Secondary | ICD-10-CM | POA: Diagnosis not present

## 2023-02-02 DIAGNOSIS — E782 Mixed hyperlipidemia: Secondary | ICD-10-CM

## 2023-02-02 DIAGNOSIS — Z720 Tobacco use: Secondary | ICD-10-CM

## 2023-02-02 DIAGNOSIS — N5201 Erectile dysfunction due to arterial insufficiency: Secondary | ICD-10-CM

## 2023-02-02 DIAGNOSIS — Z23 Encounter for immunization: Secondary | ICD-10-CM

## 2023-02-02 DIAGNOSIS — R7303 Prediabetes: Secondary | ICD-10-CM

## 2023-02-02 LAB — CBC WITH DIFFERENTIAL/PLATELET
Basophils Relative: 1 %
Hemoglobin: 11.7 g/dL — ABNORMAL LOW (ref 13.2–17.1)
MPV: 10.1 fL (ref 7.5–12.5)
Monocytes Relative: 9.6 %
RDW: 12.9 % (ref 11.0–15.0)

## 2023-02-02 MED ORDER — IRBESARTAN 75 MG PO TABS
75.0000 mg | ORAL_TABLET | Freq: Every morning | ORAL | 0 refills | Status: DC
Start: 2023-02-02 — End: 2023-07-04

## 2023-02-02 MED ORDER — ATORVASTATIN CALCIUM 80 MG PO TABS: 80.0000 mg | ORAL_TABLET | Freq: Every day | ORAL | 0 refills | Status: DC

## 2023-02-02 MED ORDER — PANTOPRAZOLE SODIUM 40 MG PO TBEC: 40.0000 mg | DELAYED_RELEASE_TABLET | Freq: Every day | ORAL | 0 refills | Status: DC

## 2023-02-02 MED ORDER — AMLODIPINE BESYLATE 5 MG PO TABS: 5.0000 mg | ORAL_TABLET | Freq: Every day | ORAL | 0 refills | Status: DC

## 2023-02-02 MED ORDER — METOPROLOL SUCCINATE ER 50 MG PO TB24: ORAL_TABLET | ORAL | 0 refills | Status: DC

## 2023-02-02 NOTE — Progress Notes (Signed)
        Established patient visit  History, exam, impression, and plan:  1. Essential hypertension Pleasant 67 year old male presenting today for follow-up on hypertension.  He is managed by cardiology however some of his medications are prescribed by our office.  Blood pressure today looks great at 125/69 and he is up-to-date on his follow-ups.  Ems for a low-sodium diet but is not getting any regular intentional exercise.  Denies CP, SOB, LE edema, HA, palpitations, and dizziness.  Does admit to some leg pain after standing/walking for long periods of time.  Checking labs as below.  Plan to continue medications as prescribed. - CBC with Differential/Platelet - COMPLETE METABOLIC PANEL WITH GFR  2. Tobacco use Long history of tobacco use with reported 43 years smoking at least 1 pack/day.  He did quit smoking 4 years ago.  Reviewed recommendations for CT lung cancer screening.  He would like to speak with his spouse about this and will let us know.  3. Need for vaccination for Strep pneumoniae Declined today.  4. Colon cancer screening Discussed recommendations for colon cancer screening.  He has never had this done and is not sure he would like a referral today.  He will discuss with his spouse and let us know.  5. Prediabetes Checking hemoglobin A1c. - Hemoglobin A1c  6. Mixed hyperlipidemia Recent lipids checked with cardiology recommendations to continue Lipitor 80 mg daily and add Zetia 10 mg daily.  Continue with these medications as instructed.  7. Erectile dysfunction due to arterial insufficiency Reports that he does have issues with erectile dysfunction and this has been going on for a while.  He is interested in medications that may help with the situation.  I did review with him the risks associated with using oral medications for erectile dysfunction in patients with cardiac history and as needed Nitrostat.  Since we will be avoiding oral medications, I did speak with him  about other potential treatments that would require a referral to urology.  He is agreeable so referral placed. - Ambulatory referral to Urology  Procedures performed this visit: None.  Return in about 6 months (around 08/04/2023) for chronic disease follow up.  __________________________________ Thayer Ohm, DNP, APRN, FNP-BC Primary Care and Sports Medicine [facility] [place]

## 2023-02-03 LAB — COMPLETE METABOLIC PANEL WITH GFR
AG Ratio: 1.8 (calc) (ref 1.0–2.5)
ALT: 15 U/L (ref 9–46)
AST: 13 U/L (ref 10–35)
Albumin: 4.3 g/dL (ref 3.6–5.1)
Alkaline phosphatase (APISO): 69 U/L (ref 35–144)
BUN: 22 mg/dL (ref 7–25)
CO2: 26 mmol/L (ref 20–32)
Calcium: 9.4 mg/dL (ref 8.6–10.3)
Chloride: 103 mmol/L (ref 98–110)
Creat: 0.92 mg/dL (ref 0.70–1.35)
Globulin: 2.4 g/dL (calc) (ref 1.9–3.7)
Glucose, Bld: 121 mg/dL — ABNORMAL HIGH (ref 65–99)
Potassium: 4.5 mmol/L (ref 3.5–5.3)
Sodium: 137 mmol/L (ref 135–146)
Total Bilirubin: 0.3 mg/dL (ref 0.2–1.2)
Total Protein: 6.7 g/dL (ref 6.1–8.1)
eGFR: 92 mL/min/{1.73_m2} (ref >=60)

## 2023-02-03 LAB — HEMOGLOBIN A1C
Hgb A1c MFr Bld: 6.7 % of total Hgb — ABNORMAL HIGH (ref <5.7)
Mean Plasma Glucose: 146 mg/dL
eAG (mmol/L): 8.1 mmol/L

## 2023-02-03 LAB — CBC WITH DIFFERENTIAL/PLATELET
Absolute Monocytes: 941 cells/uL (ref 200–950)
Basophils Absolute: 98 cells/uL (ref 0–200)
Eosinophils Absolute: 186 cells/uL (ref 15–500)
Eosinophils Relative: 1.9 %
HCT: 35.4 % — ABNORMAL LOW (ref 38.5–50.0)
Lymphs Abs: 2136 cells/uL (ref 850–3900)
MCH: 28.4 pg (ref 27.0–33.0)
MCHC: 33.1 g/dL (ref 32.0–36.0)
MCV: 85.9 fL (ref 80.0–100.0)
Neutro Abs: 6439 cells/uL (ref 1500–7800)
Neutrophils Relative %: 65.7 %
Platelets: 391 10*3/uL (ref 140–400)
RBC: 4.12 10*6/uL — ABNORMAL LOW (ref 4.20–5.80)
Total Lymphocyte: 21.8 %
WBC: 9.8 10*3/uL (ref 3.8–10.8)

## 2023-02-20 ENCOUNTER — Other Ambulatory Visit: Payer: Self-pay | Admitting: Cardiology

## 2023-03-06 ENCOUNTER — Encounter: Payer: Self-pay | Admitting: Medical-Surgical

## 2023-03-06 ENCOUNTER — Ambulatory Visit (INDEPENDENT_AMBULATORY_CARE_PROVIDER_SITE_OTHER): Payer: PRIVATE HEALTH INSURANCE | Admitting: Medical-Surgical

## 2023-03-06 VITALS — BP 113/77 | HR 72 | Ht 66.0 in | Wt 222.1 lb

## 2023-03-06 DIAGNOSIS — E119 Type 2 diabetes mellitus without complications: Secondary | ICD-10-CM | POA: Diagnosis not present

## 2023-03-06 MED ORDER — LANCETS MISC. MISC: 1.0000 | Freq: Two times a day (BID) | 11 refills | Status: AC | PRN

## 2023-03-06 MED ORDER — BLOOD GLUCOSE MONITORING SUPPL DEVI: 1.0000 | Freq: Two times a day (BID) | 0 refills | Status: AC | PRN

## 2023-03-06 MED ORDER — LANCET DEVICE MISC: 1.0000 | Freq: Two times a day (BID) | 0 refills | Status: AC | PRN

## 2023-03-06 MED ORDER — BLOOD GLUCOSE TEST VI STRP: 1.0000 | ORAL_STRIP | Freq: Two times a day (BID) | 11 refills | Status: AC | PRN

## 2023-03-06 MED ORDER — OZEMPIC (0.25 OR 0.5 MG/DOSE) 2 MG/3ML ~~LOC~~ SOPN: PEN_INJECTOR | SUBCUTANEOUS | 1 refills | Status: DC

## 2023-03-06 NOTE — Progress Notes (Signed)
        Established patient visit  History, exam, impression, and plan:  1. Controlled type 2 diabetes mellitus without complication, without long-term current use of insulin [facility]) Pleasant 67 year old male presenting today to review recent labs and the new diagnosis of diabetes. He has no family history of diabetes and is shocked by the diagnosis. He admits that when he stopped smoking, he began to gain weight. Also admits to eating a lot of pasta and enjoying sweets on a regular basis. Discussed the relationship between dietary intake, obesity, and diabetes. He is currently on a statin and ARB for CV and renal protection. Had an eye exam about 4-5 months ago but this was not a full diabetic exam. Recommend he update this with his eye doctor and make sure to get annual diabetic eye exams. Referring to diabetic education. Printed information provided and verbally discussed dietary modifications while waiting to get scheduled. Unable to provide a urine sample to check microalbumin today so will plan to do this at his next visit. Prescription for a glucometer sent to his pharmacy with request that he check fasting sugars 2-3 times weekly. Discussed medications. At 6.7%, a medication is not mandatory but he is interested in Ozempic to manage diabetes and help with weight loss. Starting Ozempic 0.25mg  weekly for 4 weeks then increase to 0.5mg  weekly.  - Ambulatory referral to diabetic education  Procedures performed this visit: None.  Return in about 2 months (around 05/07/2023) for DM follow up.  __________________________________ Thayer Ohm, DNP, APRN, FNP-BC Primary Care and Sports Medicine [facility] [place]

## 2023-03-09 ENCOUNTER — Other Ambulatory Visit: Payer: PRIVATE HEALTH INSURANCE

## 2023-03-11 ENCOUNTER — Other Ambulatory Visit: Payer: Self-pay | Admitting: Medical-Surgical

## 2023-03-12 ENCOUNTER — Telehealth: Payer: Self-pay | Admitting: Medical-Surgical

## 2023-03-12 ENCOUNTER — Other Ambulatory Visit: Payer: Self-pay | Admitting: Medical-Surgical

## 2023-03-12 MED ORDER — OZEMPIC (0.25 OR 0.5 MG/DOSE) 2 MG/3ML ~~LOC~~ SOPN: PEN_INJECTOR | SUBCUTANEOUS | 1 refills | Status: DC

## 2023-03-12 NOTE — Telephone Encounter (Signed)
Pt is need of Ozempic  It was called in on 07/02. Pharmacy is telling patient that it is needing to be called in twice. Pt also stated that Accu Check kit was not paid  for by insurance because it was not called in. Pt says he has been out of meds now since 07/02

## 2023-03-12 NOTE — Progress Notes (Signed)
According to records, there was an e-prescribing error regarding the Ozempic sent over on 03/11/23. I have resent the Ozempic to the pharmacy and received confirmation that it went through.   As for the Accucheck kit, I sent prescriptions for the glucometer, lancets, test strips, etc to the pharmacy for them to substitute any manufacturer per the patient's insurance. This was a generic prescription with no brand specified. His pharmacy was responsible for advising what would be covered so I do not know how he received a kit that was not covered. Please have him speak with the pharmacist there regarding the issue.   ___________________________________________ Thayer Ohm, DNP, APRN, FNP-BC Primary Care and Sports Medicine [facility] [place]

## 2023-03-13 NOTE — Telephone Encounter (Signed)
Pt called. He states that his insurance is requiring PA for his Ozempic and they are waiting for [place] to send  form to them. He came in person yesterday. He is upset.

## 2023-03-14 ENCOUNTER — Telehealth: Payer: Self-pay

## 2023-03-14 NOTE — Telephone Encounter (Signed)
Initiated Prior authorization [id number] (0.25 or 0.5 MG/DOSE) 2MG /3ML pen-injector Via: Covermymeds Case/Key:[id number] Status: approved as of 03/14/23 Reason:Authorization Expiration Date: March 13, 2026. Notified Pt via: pt does not have Mychart, left vm of approval

## 2023-03-15 ENCOUNTER — Other Ambulatory Visit: Payer: PRIVATE HEALTH INSURANCE

## 2023-03-15 NOTE — Telephone Encounter (Signed)
Attempted call to patient. Left a voice mail message requesting a return call.  

## 2023-03-15 NOTE — Telephone Encounter (Signed)
Attempted call to patient. Left voice mail message requesting a return call.  

## 2023-03-16 NOTE — Telephone Encounter (Signed)
Spoke with patient. He has been scheduled for a nurse visit to teach how to use glucometer and ozempic .

## 2023-03-19 ENCOUNTER — Ambulatory Visit (INDEPENDENT_AMBULATORY_CARE_PROVIDER_SITE_OTHER): Payer: PRIVATE HEALTH INSURANCE | Admitting: Medical-Surgical

## 2023-03-19 DIAGNOSIS — E119 Type 2 diabetes mellitus without complications: Secondary | ICD-10-CM | POA: Diagnosis not present

## 2023-03-19 NOTE — Progress Notes (Signed)
Patient presents today to go over and discuss his Ozempic pen and glucometer and I advised patient on how to inject himself and how to check his sugars and he did have concerns of not remembering all of it and I advised him to please reach out if he has any questions or needs help understanding again.

## 2023-05-06 ENCOUNTER — Other Ambulatory Visit: Payer: Self-pay | Admitting: Medical-Surgical

## 2023-05-08 ENCOUNTER — Ambulatory Visit (INDEPENDENT_AMBULATORY_CARE_PROVIDER_SITE_OTHER): Payer: PRIVATE HEALTH INSURANCE | Admitting: Medical-Surgical

## 2023-05-08 ENCOUNTER — Encounter: Payer: Self-pay | Admitting: Medical-Surgical

## 2023-05-08 VITALS — BP 100/64 | HR 74 | Ht 65.0 in | Wt 209.1 lb

## 2023-05-08 DIAGNOSIS — E119 Type 2 diabetes mellitus without complications: Secondary | ICD-10-CM | POA: Diagnosis not present

## 2023-05-08 LAB — POCT GLYCOSYLATED HEMOGLOBIN (HGB A1C): Hemoglobin A1C: 6.2 % — AB (ref 4.0–5.6)

## 2023-05-08 LAB — POCT UA - MICROALBUMIN
Albumin/Creatinine Ratio, Urine, POC: <30
Creatinine, POC: 300 mg/dL
Microalbumin Ur, POC: 30 mg/L

## 2023-05-08 MED ORDER — ATORVASTATIN CALCIUM 80 MG PO TABS: 80.0000 mg | ORAL_TABLET | Freq: Every day | ORAL | 3 refills | Status: DC

## 2023-05-08 NOTE — Progress Notes (Signed)
        Established patient visit  History, exam, impression, and plan:  1. Controlled type 2 diabetes mellitus without complication, without long-term current use of insulin [facility]) Pleasant 67 year old male presenting today for follow up on type 2 diabetes. Has been taking Ozempic 0.25mg  weekly and now has bumped up to the 0.5mg  dose. Has cut out sweets and is avoid white rice/bread. Only drinking zero sugar soda. Checking sugars twice daily with recent fasting readings at goal. POCT A1c today at 6.2%. Microalbumin normal. Scheduled for diabetic education classes but not until October. Reports having trouble with his diet because he doesn't know what is okay to eat. Discussed online options. Website for diabetes.org provided with AVS for his review. Continue Ozempic 0.5mg  weekly. Ok to reduce glucose checks to twice weekly with a fasting goal of 90-120.  - POCT HgB A1C - POCT UA - Microalbumin  Procedures performed this visit: None.  Return in about 6 months (around 11/05/2023) for DM/HTN/HLD follow up.  __________________________________ Thayer Ohm, DNP, APRN, FNP-BC Primary Care and Sports Medicine [facility] [place]

## 2023-05-08 NOTE — Patient Instructions (Signed)
https://diabetes.[email or web address]-nutrition/diabetes-friendly-recipes

## 2023-06-19 ENCOUNTER — Encounter: Payer: PRIVATE HEALTH INSURANCE | Admitting: Dietician

## 2023-07-03 ENCOUNTER — Other Ambulatory Visit: Payer: Self-pay | Admitting: Medical-Surgical

## 2023-07-03 DIAGNOSIS — I10 Essential (primary) hypertension: Secondary | ICD-10-CM

## 2023-07-05 ENCOUNTER — Other Ambulatory Visit: Payer: Self-pay | Admitting: Medical-Surgical

## 2023-07-06 ENCOUNTER — Other Ambulatory Visit: Payer: Self-pay | Admitting: Medical-Surgical

## 2023-07-06 DIAGNOSIS — I10 Essential (primary) hypertension: Secondary | ICD-10-CM

## 2023-07-29 ENCOUNTER — Other Ambulatory Visit: Payer: Self-pay | Admitting: Medical-Surgical

## 2023-08-06 ENCOUNTER — Other Ambulatory Visit: Payer: Self-pay | Admitting: Medical-Surgical

## 2023-09-11 ENCOUNTER — Other Ambulatory Visit: Payer: PRIVATE HEALTH INSURANCE

## 2023-09-19 ENCOUNTER — Ambulatory Visit: Payer: PRIVATE HEALTH INSURANCE | Admitting: Urology

## 2023-09-23 ENCOUNTER — Other Ambulatory Visit: Payer: Self-pay | Admitting: Medical-Surgical

## 2023-09-24 ENCOUNTER — Other Ambulatory Visit: Payer: Self-pay

## 2023-09-24 ENCOUNTER — Other Ambulatory Visit: Payer: Self-pay | Admitting: Medical-Surgical

## 2023-09-24 MED ORDER — OZEMPIC (0.25 OR 0.5 MG/DOSE) 2 MG/3ML ~~LOC~~ SOPN: 0.5000 mg | PEN_INJECTOR | SUBCUTANEOUS | 1 refills | Status: DC

## 2023-09-24 NOTE — Telephone Encounter (Signed)
Attempted call to patient. Left a detailed voice mail message on home # ( allowed on DPR )  that prescription has been sent to the pharmacy

## 2023-09-24 NOTE — Telephone Encounter (Signed)
Patient out of Ozempic  Last written and taken as 0.5mg  Should refill be sent at this strength? In note patient should return in March 2025.  Next schld appt 11/05/2023

## 2023-09-24 NOTE — Telephone Encounter (Signed)
Copied from CRM [id number]. Topic: Clinical - Prescription Issue >> Sep 24, 2023  2:51 PM Nila Nephew wrote: Reason for CRM: Patient calling very upset because he states that [facility] sent in the refill request for his Ozempic this morning and it has not been sent in yet. Advised patient that providers have three business days to respond to medication refill requests. Patient states he knows that but patient feels he should be an exception because he is out of his medication.

## 2023-09-26 ENCOUNTER — Telehealth: Payer: Self-pay | Admitting: Medical-Surgical

## 2023-09-26 NOTE — Telephone Encounter (Signed)
Pt came in and gave new insurance. Needs prior auth to get refill on Ozempic. New insurance now in system. Pt is in need of refill asap.

## 2023-09-27 ENCOUNTER — Telehealth: Payer: Self-pay

## 2023-09-27 NOTE — Telephone Encounter (Signed)
Copied from CRM [id number]. Topic: Clinical - Prescription Issue >> Sep 27, 2023  2:45 PM Shelah Lewandowsky wrote: Reason for CRM: Semaglutide,0.25 or 0.5MG /DOS, (OZEMPIC, 0.25 OR 0.5 MG/DOSE,) 2 MG/3ML SOPN  - waiting for pre authorization for new insurance and patient is completely out. Spoke with office.

## 2023-09-29 ENCOUNTER — Other Ambulatory Visit: Payer: Self-pay | Admitting: Medical-Surgical

## 2023-09-30 ENCOUNTER — Other Ambulatory Visit: Payer: Self-pay | Admitting: Medical-Surgical

## 2023-09-30 DIAGNOSIS — I10 Essential (primary) hypertension: Secondary | ICD-10-CM

## 2023-10-01 ENCOUNTER — Telehealth: Payer: Self-pay

## 2023-10-01 DIAGNOSIS — E119 Type 2 diabetes mellitus without complications: Secondary | ICD-10-CM | POA: Insufficient documentation

## 2023-10-01 NOTE — Telephone Encounter (Signed)
Prior auth for: [facility] Determination: Pending as of 09/30/22 Auth #: [id number] Valid from: n/a Patient notified via telephone call.

## 2023-10-01 NOTE — Telephone Encounter (Signed)
Sending as an Financial planner

## 2023-10-01 NOTE — Telephone Encounter (Signed)
Prior auth for: [facility] Determination: APPROVED Auth #: [id number] Valid from: 10/01/23 to 09/30/24 Patient notified via telephone call.

## 2023-10-02 ENCOUNTER — Telehealth: Payer: Self-pay

## 2023-10-02 NOTE — Telephone Encounter (Signed)
Spoke with pharmacy.   Patient picked up medication yesterday.

## 2023-10-02 NOTE — Telephone Encounter (Signed)
Copied from CRM [id number]. Topic: Clinical - Prescription Issue >> Oct 01, 2023  1:35 PM Nila Nephew wrote: Reason for CRM: Patient calling in to inquire about status of PA for Ozempic. Patient is upset that he is completely out and has heard nothing. He states is has been four days and insurance has heard nothing. Patient requesting immediate update.   Patient does not want a note sent in, as it has been done several times and nothing has been done.

## 2023-10-06 ENCOUNTER — Other Ambulatory Visit: Payer: Self-pay | Admitting: Medical-Surgical

## 2023-10-07 ENCOUNTER — Other Ambulatory Visit: Payer: Self-pay | Admitting: Medical-Surgical

## 2023-10-07 DIAGNOSIS — I10 Essential (primary) hypertension: Secondary | ICD-10-CM

## 2023-10-24 ENCOUNTER — Other Ambulatory Visit: Payer: Self-pay | Admitting: Medical-Surgical

## 2023-10-24 DIAGNOSIS — I10 Essential (primary) hypertension: Secondary | ICD-10-CM

## 2023-10-27 ENCOUNTER — Other Ambulatory Visit: Payer: Self-pay | Admitting: Medical-Surgical

## 2023-10-28 ENCOUNTER — Other Ambulatory Visit: Payer: Self-pay | Admitting: Medical-Surgical

## 2023-10-28 DIAGNOSIS — I10 Essential (primary) hypertension: Secondary | ICD-10-CM

## 2023-11-02 ENCOUNTER — Ambulatory Visit: Payer: Self-pay | Admitting: Medical-Surgical

## 2023-11-02 ENCOUNTER — Other Ambulatory Visit: Payer: Self-pay | Admitting: Medical-Surgical

## 2023-11-02 DIAGNOSIS — I10 Essential (primary) hypertension: Secondary | ICD-10-CM

## 2023-11-02 NOTE — Telephone Encounter (Signed)
 Last Fill: 10/02/23 90 tabs/0 RF  Last OV: 05/08/23 Next OV: 11/05/23  Routing to provider for review/authorization.

## 2023-11-02 NOTE — Telephone Encounter (Signed)
 Copied from CRM [id number]. Topic: Clinical - Prescription Issue >> Nov 02, 2023  3:32 PM Adrianna P wrote: Reason for CRM: Patient having issues with prescription for irbesartan    Chief Complaint: Medication issue  Additional Notes: Pt reports that he is having issues gettind irbesartan refilled. Per chart review, order was for 90 day tabs on 01/28, so refill not due till April. Called Pharmacy and confirmed 90 tabs was dispensed. Pt stating he only rec'd 30. Call made me CAL, spoke to [employer]. New rx ordered, pt advised that he may have to pay out of pocket.  Reason for Disposition  [1] Prescription refill request for ESSENTIAL medicine (i.e., likelihood of harm to patient if not taken) AND [2] triager unable to refill per department policy  Answer Assessment - Initial Assessment Questions 1. DRUG NAME: "What medicine do you need to have refilled?"     Irbesartan  2. REFILLS REMAINING: "How many refills are remaining?" (Note: The label on the medicine or pill bottle will show how many refills are remaining. If there are no refills remaining, then a renewal may be needed.)     0  3. EXPIRATION DATE: "What is the expiration date?" (Note: The label states when the prescription will expire, and thus can no longer be refilled.)     04/28  4. PRESCRIBING HCP: "Who prescribed it?" Reason: If prescribed by specialist, call should be referred to that group.     Joy Jessup  5. SYMPTOMS: "Do you have any symptoms?"     No  Protocols used: Medication Refill and Renewal Call-A-AH

## 2023-11-02 NOTE — Telephone Encounter (Signed)
 Copied from CRM [id number]. Topic: Clinical - Medication Refill >> Nov 02, 2023  3:30 PM Nila Nephew wrote: Most Recent Primary Care Visit:  Provider: Christen Butter  Department: [facility]  Visit Type: OFFICE VISIT  Date: 05/08/2023  Medication:  irbesartan (AVAPRO) 75 MG tablet  Has the patient contacted their pharmacy? Yes - Pharmacy states patient is unwilling to accept that refill takes time.   Is this the correct pharmacy for this prescription? Yes  This is the patient's preferred pharmacy:  [id number] - [place], [place] - [address] [place] [address] Phone: [phone] Fax: [phone]   Has the prescription been filled recently? No  Is the patient out of the medication? Yes  Has the patient been seen for an appointment in the last year OR does the patient have an upcoming appointment? Yes  Can we respond through MyChart? No  Agent: Please be advised that Rx refills may take up to 3 business days. We ask that you follow-up with your pharmacy.

## 2023-11-02 NOTE — Telephone Encounter (Signed)
 Prescription because patient states he is out and only received a 30 day supply and not a 90 day.

## 2023-11-05 ENCOUNTER — Encounter: Payer: Self-pay | Admitting: Medical-Surgical

## 2023-11-05 ENCOUNTER — Ambulatory Visit (INDEPENDENT_AMBULATORY_CARE_PROVIDER_SITE_OTHER): Payer: 59 | Admitting: Medical-Surgical

## 2023-11-05 VITALS — BP 102/67 | HR 82 | Resp 20 | Ht 65.0 in | Wt 190.1 lb

## 2023-11-05 DIAGNOSIS — E119 Type 2 diabetes mellitus without complications: Secondary | ICD-10-CM

## 2023-11-05 DIAGNOSIS — I10 Essential (primary) hypertension: Secondary | ICD-10-CM | POA: Diagnosis not present

## 2023-11-05 DIAGNOSIS — E782 Mixed hyperlipidemia: Secondary | ICD-10-CM | POA: Diagnosis not present

## 2023-11-05 DIAGNOSIS — Z125 Encounter for screening for malignant neoplasm of prostate: Secondary | ICD-10-CM

## 2023-11-05 DIAGNOSIS — F5101 Primary insomnia: Secondary | ICD-10-CM

## 2023-11-05 DIAGNOSIS — K219 Gastro-esophageal reflux disease without esophagitis: Secondary | ICD-10-CM | POA: Diagnosis not present

## 2023-11-05 LAB — POCT GLYCOSYLATED HEMOGLOBIN (HGB A1C)
HbA1c, POC (controlled diabetic range): 5.7 % (ref 0.0–7.0)
Hemoglobin A1C: 5.7 % — AB (ref 4.0–5.6)

## 2023-11-05 MED ORDER — OZEMPIC (0.25 OR 0.5 MG/DOSE) 2 MG/3ML ~~LOC~~ SOPN: 0.5000 mg | PEN_INJECTOR | SUBCUTANEOUS | 3 refills | Status: DC

## 2023-11-05 MED ORDER — TRAZODONE HCL 50 MG PO TABS: ORAL_TABLET | ORAL | 3 refills | Status: AC

## 2023-11-05 MED ORDER — AMLODIPINE BESYLATE 5 MG PO TABS: 5.0000 mg | ORAL_TABLET | Freq: Every day | ORAL | 3 refills | Status: DC

## 2023-11-05 MED ORDER — METOPROLOL SUCCINATE ER 50 MG PO TB24: ORAL_TABLET | ORAL | 3 refills | Status: DC

## 2023-11-05 MED ORDER — ATORVASTATIN CALCIUM 80 MG PO TABS: 80.0000 mg | ORAL_TABLET | Freq: Every day | ORAL | 3 refills | Status: DC

## 2023-11-05 MED ORDER — PANTOPRAZOLE SODIUM 40 MG PO TBEC: 40.0000 mg | DELAYED_RELEASE_TABLET | Freq: Every day | ORAL | 3 refills | Status: AC

## 2023-11-05 MED ORDER — IRBESARTAN 75 MG PO TABS: 75.0000 mg | ORAL_TABLET | Freq: Every morning | ORAL | 3 refills | Status: DC

## 2023-11-05 NOTE — Progress Notes (Signed)
        Established patient visit  History, exam, impression, and plan:  1. Hypertension goal BP (blood pressure) < 130/80 (Primary) Carlos Valentine 68 year old male presenting today for follow-up on hypertension.  He is currently taking amlodipine 5 mg, irbesartan 75 mg, and Toprol-XL 50 mg daily, tolerating well without side effects.  Not regularly checking blood pressures at home but his readings look great here today.  Following a low-sodium diet and staying physically active as tolerated.  Denies concerning symptoms today.  Cardiopulmonary exam normal.  Checking labs as below.  Blood pressure is at goal at 102/67.  Continue medications as prescribed. - amLODipine (NORVASC) 5 MG tablet; Take 1 tablet (5 mg total) by mouth at bedtime.  Dispense: 90 tablet; Refill: 3 - irbesartan (AVAPRO) 75 MG tablet; Take 1 tablet (75 mg total) by mouth every morning.  Dispense: 90 tablet; Refill: 3 - metoprolol succinate (TOPROL-XL) 50 MG 24 hr tablet; TAKE 1 TABLET BY MOUTH ONCE DAILY WITH MEALS  Dispense: 90 tablet; Refill: 3 - CBC with Differential/Platelet - CMP14+EGFR - Lipid panel  2. Controlled type 2 diabetes mellitus without complication, without long-term current use of insulin (HCC) History of type 2 diabetes with last hemoglobin A1c is 6.2% showing excellent control.  He is taking semaglutide 0.5 mg weekly, tolerating well without side effects.  Following a low-carb diet limited in concentrated sweets and simple carbohydrates.  Checking sugars twice weekly and reports that they have all been at or below goal.  Foot exam completed today.  Plan to check labs as below.  Repeat hemoglobin A1c at 5.7%.  Continue semaglutide 0.5 mg weekly. - POCT HgB A1C - HM Diabetes Foot Exam - Semaglutide,0.25 or 0.5MG /DOS, (OZEMPIC, 0.25 OR 0.5 MG/DOSE,) 2 MG/3ML SOPN; Inject 0.5 mg into the skin once a week.  Dispense: 6 mL; Refill: 3 - CMP14+EGFR - Lipid panel  3. Mixed hyperlipidemia Currently taking Lipitor 80 mg  daily, tolerating well without side effects.  Works to follow a low-fat heart healthy diet.  Activity as tolerated and has a physically demanding job.  Checking labs as below.  Continue atorvastatin. - atorvastatin (LIPITOR) 80 MG tablet; Take 1 tablet (80 mg total) by mouth daily.  Dispense: 90 tablet; Refill: 3 - CMP14+EGFR - Lipid panel  4. Gastroesophageal reflux disease, unspecified whether esophagitis present Has been taking Protonix 40 mg daily, tolerating well without side effects.  Feels the medication works well to help with controlling his reflux.  Denies any concerning symptoms.  Checking CBC with differential.  Continue Protonix as prescribed. - pantoprazole (PROTONIX) 40 MG tablet; Take 1 tablet (40 mg total) by mouth daily.  Dispense: 90 tablet; Refill: 3 - CBC with Differential/Platelet  5. Primary insomnia Has been using trazodone 50 mg nightly as needed for sleep.  Reports the medication still works to help him sleep without leaving him excessively groggy.  Continue trazodone as prescribed. - traZODone (DESYREL) 50 MG tablet; TAKE 1/2 TO 1 (ONE-HALF TO ONE) TABLET BY MOUTH AT BEDTIME AS NEEDED FOR SLEEP  Dispense: 90 tablet; Refill: 3  6. Prostate cancer screening Checking PSA today. - PSA Total (Reflex To Free)   Procedures performed this visit: None.  Return in about 6 months (around 05/07/2024) for DM/HTN/HLD follow up.  __________________________________ Thayer Ohm, DNP, APRN, FNP-BC Primary Care and Sports Medicine [facility] [place]

## 2023-11-06 LAB — CBC WITH DIFFERENTIAL/PLATELET
Basophils Absolute: 0.1 10*3/uL (ref 0.0–0.2)
Basos: 1 %
EOS (ABSOLUTE): 0.2 10*3/uL (ref 0.0–0.4)
Eos: 2 %
Hematocrit: 36.6 % — ABNORMAL LOW (ref 37.5–51.0)
Hemoglobin: 12.3 g/dL — ABNORMAL LOW (ref 13.0–17.7)
Immature Grans (Abs): 0 10*3/uL (ref 0.0–0.1)
Immature Granulocytes: 0 %
Lymphocytes Absolute: 1.8 10*3/uL (ref 0.7–3.1)
Lymphs: 21 %
MCH: 30.2 pg (ref 26.6–33.0)
MCHC: 33.6 g/dL (ref 31.5–35.7)
MCV: 90 fL (ref 79–97)
Monocytes Absolute: 0.8 10*3/uL (ref 0.1–0.9)
Monocytes: 9 %
Neutrophils Absolute: 5.9 10*3/uL (ref 1.4–7.0)
Neutrophils: 67 %
Platelets: 366 10*3/uL (ref 150–450)
RBC: 4.07 x10E6/uL — ABNORMAL LOW (ref 4.14–5.80)
RDW: 12.7 % (ref 11.6–15.4)
WBC: 8.8 10*3/uL (ref 3.4–10.8)

## 2023-11-06 LAB — CMP14+EGFR
ALT: 18 IU/L (ref 0–44)
AST: 16 IU/L (ref 0–40)
Albumin: 4.2 g/dL (ref 3.9–4.9)
Alkaline Phosphatase: 74 IU/L (ref 44–121)
BUN/Creatinine Ratio: 26 — ABNORMAL HIGH (ref 10–24)
BUN: 23 mg/dL (ref 8–27)
Bilirubin Total: 0.3 mg/dL (ref 0.0–1.2)
CO2: 21 mmol/L (ref 20–29)
Calcium: 9.4 mg/dL (ref 8.6–10.2)
Chloride: 106 mmol/L (ref 96–106)
Creatinine, Ser: 0.88 mg/dL (ref 0.76–1.27)
Globulin, Total: 2.3 g/dL (ref 1.5–4.5)
Glucose: 110 mg/dL — ABNORMAL HIGH (ref 70–99)
Potassium: 4.1 mmol/L (ref 3.5–5.2)
Sodium: 139 mmol/L (ref 134–144)
Total Protein: 6.5 g/dL (ref 6.0–8.5)
eGFR: 95 mL/min/{1.73_m2} (ref >59)

## 2023-11-06 LAB — LIPID PANEL
Chol/HDL Ratio: 2.4 ratio (ref 0.0–5.0)
Cholesterol, Total: 80 mg/dL — ABNORMAL LOW (ref 100–199)
HDL: 33 mg/dL — ABNORMAL LOW (ref >39)
LDL Chol Calc (NIH): 28 mg/dL (ref 0–99)
Triglycerides: 94 mg/dL (ref 0–149)
VLDL Cholesterol Cal: 19 mg/dL (ref 5–40)

## 2023-11-06 LAB — PSA TOTAL (REFLEX TO FREE): Prostate Specific Ag, Serum: 2 ng/mL (ref 0.0–4.0)

## 2023-11-16 NOTE — Telephone Encounter (Signed)
 Prior auth for: [facility] Determination: APPROVED Auth #: [id number] Valid from: 10/01/23 to 09/30/24 Patient notified via telephone call.

## 2023-11-21 ENCOUNTER — Other Ambulatory Visit: Payer: Self-pay | Admitting: Cardiology

## 2023-12-19 ENCOUNTER — Other Ambulatory Visit: Payer: Self-pay | Admitting: Cardiology

## 2024-01-08 ENCOUNTER — Other Ambulatory Visit: Payer: Self-pay

## 2024-01-08 MED ORDER — CLOPIDOGREL BISULFATE 75 MG PO TABS: 75.0000 mg | ORAL_TABLET | Freq: Every day | ORAL | 0 refills | Status: DC

## 2024-01-22 ENCOUNTER — Telehealth: Payer: Self-pay | Admitting: Cardiology

## 2024-01-22 MED ORDER — CLOPIDOGREL BISULFATE 75 MG PO TABS: 75.0000 mg | ORAL_TABLET | Freq: Every day | ORAL | 0 refills | Status: DC

## 2024-01-22 NOTE — Telephone Encounter (Signed)
*  STAT* If patient is at the pharmacy, call can be transferred to refill team.   1. Which medications need to be refilled? (please list name of each medication and dose if known)   clopidogrel  (PLAVIX ) 75 MG tablet   2. Which pharmacy/location (including street and city if local pharmacy) is medication to be sent to? [id number] - [place], [place] - [address] Phone: [phone]  Fax: [phone]     3. Do they need a 30 day or 90 day supply? Pt has made for  03/10/24 please refill up til then. Per pt he is out of medication

## 2024-01-22 NOTE — Telephone Encounter (Signed)
 RX sent to requested Pharmacy

## 2024-03-10 ENCOUNTER — Encounter: Payer: Self-pay | Admitting: Cardiology

## 2024-03-10 ENCOUNTER — Ambulatory Visit: Attending: Cardiology | Admitting: Cardiology

## 2024-03-10 VITALS — BP 112/62 | HR 72 | Ht 65.0 in | Wt 187.8 lb

## 2024-03-10 DIAGNOSIS — I10 Essential (primary) hypertension: Secondary | ICD-10-CM | POA: Diagnosis not present

## 2024-03-10 DIAGNOSIS — R55 Syncope and collapse: Secondary | ICD-10-CM

## 2024-03-10 DIAGNOSIS — Z9861 Coronary angioplasty status: Secondary | ICD-10-CM | POA: Diagnosis not present

## 2024-03-10 DIAGNOSIS — I471 Supraventricular tachycardia, unspecified: Secondary | ICD-10-CM | POA: Diagnosis not present

## 2024-03-10 DIAGNOSIS — E782 Mixed hyperlipidemia: Secondary | ICD-10-CM

## 2024-03-10 DIAGNOSIS — I2510 Atherosclerotic heart disease of native coronary artery without angina pectoris: Secondary | ICD-10-CM | POA: Diagnosis not present

## 2024-03-10 DIAGNOSIS — R29898 Other symptoms and signs involving the musculoskeletal system: Secondary | ICD-10-CM

## 2024-03-10 MED ORDER — EZETIMIBE 10 MG PO TABS: 10.0000 mg | ORAL_TABLET | Freq: Every day | ORAL | 3 refills | Status: AC

## 2024-03-10 MED ORDER — ATORVASTATIN CALCIUM 80 MG PO TABS: 80.0000 mg | ORAL_TABLET | Freq: Every day | ORAL | 3 refills | Status: AC

## 2024-03-10 MED ORDER — METOPROLOL SUCCINATE ER 50 MG PO TB24: ORAL_TABLET | ORAL | 3 refills | Status: AC

## 2024-03-10 MED ORDER — IRBESARTAN 75 MG PO TABS: 75.0000 mg | ORAL_TABLET | Freq: Every morning | ORAL | 3 refills | Status: AC

## 2024-03-10 MED ORDER — CLOPIDOGREL BISULFATE 75 MG PO TABS: 75.0000 mg | ORAL_TABLET | Freq: Every day | ORAL | 0 refills | Status: DC

## 2024-03-10 MED ORDER — AMLODIPINE BESYLATE 5 MG PO TABS: 5.0000 mg | ORAL_TABLET | Freq: Every day | ORAL | 3 refills | Status: AC

## 2024-03-10 NOTE — Addendum Note (Signed)
 Addended by: JANIT GENI CROME on: 03/10/2024 03:26 PM   Modules accepted: Orders

## 2024-03-10 NOTE — Progress Notes (Signed)
 CARDIOLOGY OFFICE NOTE  Date:  03/10/2024    Seng Larch Date of Birth: [birth year] Medical Record #[id number]  PCP:  Willo Mini, NP  Cardiologist:  Wilbert Bihari, MD  History of Present Illness: Carlos Valentine is a 68 y.o. male with a history of HTN, HLD, ongoing tobacco abuse, SVT and ASCAD with cardiac CT showing coronary calcium  score 442 with CAD and abnormal FFR in the pLAD - s/p cath showing diffuse non obstructive CAD in the LCX and RCA and severe LAD stenosis treated with DES with IVUS guidance. There was a small diagonal side-branch occlusion post-PCI with significant associated chest pain and ST change. Vessel < 1.5 mm in diameter not suitable for PCI.    He is here today for followup and is doing well.  He denies any chest pain or pressure, SOB, DOE, PND, orthopnea, LE edema, dizziness, palpitations or syncope. He is compliant with his meds and is tolerating meds with no SE.    Past Medical History:  Diagnosis Date   CAD (coronary artery disease), native coronary artery    cardiac CT showing coronary calcium  score 442 with CAD and abnormal FFR in the pLAD - s/p cath showing diffuse non obstructive CAD in the LCX and RCA and severe LAD stenosis treated with DES with IVUS guidance. There was a small diagonal side-branch occlusion post-PCI   Hyperlipidemia    Hypertension    Obesity    Tobacco use     Past Surgical History:  Procedure Laterality Date   CORONARY STENT INTERVENTION N/A 11/24/2019   Procedure: CORONARY STENT INTERVENTION;  Surgeon: Wonda Sharper, MD;  Location: [facility] INVASIVE CV LAB;  Service: Cardiovascular;  Laterality: N/A;   CORONARY ULTRASOUND/IVUS N/A 11/24/2019   Procedure: Intravascular Ultrasound/IVUS;  Surgeon: Wonda Sharper, MD;  Location: [facility] INVASIVE CV LAB;  Service: Cardiovascular;  Laterality: N/A;   LEFT HEART CATH AND CORONARY ANGIOGRAPHY N/A 11/24/2019   Procedure: LEFT HEART CATH AND CORONARY ANGIOGRAPHY;  Surgeon: Wonda Sharper, MD;  Location: [facility] INVASIVE CV LAB;  Service: Cardiovascular;  Laterality: N/A;     Medications: Current Meds  Medication Sig   acetaminophen  (TYLENOL ) 500 MG tablet Take 500 mg by mouth every 6 (six) hours as needed for mild pain or headache.   amLODipine  (NORVASC ) 5 MG tablet Take 1 tablet (5 mg total) by mouth at bedtime.   aspirin  81 MG tablet Take 81 mg by mouth in the morning.    atorvastatin  (LIPITOR ) 80 MG tablet Take 1 tablet (80 mg total) by mouth daily.   Blood Glucose Monitoring Suppl DEVI 1 each by Does not apply route 2 (two) times daily as needed. May substitute to any manufacturer covered by patient's insurance.   clopidogrel  (PLAVIX ) 75 MG tablet Take 1 tablet (75 mg total) by mouth daily.   ezetimibe  (ZETIA ) 10 MG tablet Take 1 tablet (10 mg total) by mouth daily.   Glucose Blood (BLOOD GLUCOSE TEST STRIPS) STRP 1 each by In Vitro route 2 (two) times daily as needed. May substitute to any manufacturer covered by patient's insurance.   irbesartan  (AVAPRO ) 75 MG tablet Take 1 tablet (75 mg total) by mouth every morning.   ketoconazole  (NIZORAL ) 2 % shampoo APPLY TO THE DAMP SKIN OF THE AFFECTED AREA. LEAVE ON 5 MINUTES THEN RINSE   Lancet Device MISC 1 each by Does not apply route 2 (two) times daily as needed. May substitute to any manufacturer covered by patient's insurance.   Lancets Misc. MISC 1  each by Does not apply route 2 (two) times daily as needed. May substitute to any manufacturer covered by patient's insurance.   metoprolol  succinate (TOPROL -XL) 50 MG 24 hr tablet TAKE 1 TABLET BY MOUTH ONCE DAILY WITH MEALS   nitroGLYCERIN  (NITROSTAT ) 0.4 MG SL tablet Place 1 tablet (0.4 mg total) under the tongue every 5 (five) minutes x 3 doses as needed for chest pain.   pantoprazole  (PROTONIX ) 40 MG tablet Take 1 tablet (40 mg total) by mouth daily.   Semaglutide ,0.25 or 0.5MG /DOS, (OZEMPIC , 0.25 OR 0.5 MG/DOSE,) 2 MG/3ML SOPN Inject 0.5 mg into the skin once a week.    traZODone  (DESYREL ) 50 MG tablet TAKE 1/2 TO 1 (ONE-HALF TO ONE) TABLET BY MOUTH AT BEDTIME AS NEEDED FOR SLEEP     Allergies: No Known Allergies  Social History: The patient  reports that he quit smoking about 4 years ago. His smoking use included cigarettes. He started smoking about 49 years ago. He has a 67.5 pack-year smoking history. He has never used smokeless tobacco. He reports that he does not currently use drugs. He reports that he does not drink alcohol.   Family History: The patient's family history includes Hypertension in his mother.   Review of Systems: Please see the history of present illness.   All other systems are reviewed and negative.   Physical Exam: VS:  BP 112/62   Pulse 72   Ht 5' 5 (1.651 m)   Wt 187 lb 12.8 oz (85.2 kg)   SpO2 97%   BMI 31.25 kg/m  .  BMI Body mass index is 31.25 kg/m.  Wt Readings from Last 3 Encounters:  03/10/24 187 lb 12.8 oz (85.2 kg)  11/05/23 190 lb 1.9 oz (86.2 kg)  05/08/23 209 lb 1.9 oz (94.9 kg)   GEN: Well nourished, well developed in no acute distress HEENT: Normal NECK: No JVD; No carotid bruits LYMPHATICS: No lymphadenopathy CARDIAC:RRR, no murmurs, rubs, gallops RESPIRATORY:  Clear to auscultation without rales, wheezing or rhonchi  ABDOMEN: Soft, non-tender, non-distended MUSCULOSKELETAL:  No edema; No deformity  SKIN: Warm and dry NEUROLOGIC:  Alert and oriented x 3 PSYCHIATRIC:  Normal affect   LABORATORY DATA:  Lab Results  Component Value Date   WBC 8.8 11/05/2023   HGB 12.3 (L) 11/05/2023   HCT 36.6 (L) 11/05/2023   PLT 366 11/05/2023   GLUCOSE 110 (H) 11/05/2023   CHOL 80 (L) 11/05/2023   TRIG 94 11/05/2023   HDL 33 (L) 11/05/2023   LDLCALC 28 11/05/2023   ALT 18 11/05/2023   AST 16 11/05/2023   NA 139 11/05/2023   K 4.1 11/05/2023   CL 106 11/05/2023   CREATININE 0.88 11/05/2023   BUN 23 11/05/2023   CO2 21 11/05/2023   TSH CANCELED 04/12/2020   PSA 1.6 11/11/2018   HGBA1C 5.7 (A)  11/05/2023   HGBA1C 5.7 11/05/2023   MICROALBUR 30 05/08/2023     BNP (last 3 results) No results for input(s): BNP in the last 8760 hours.  ProBNP (last 3 results) No results for input(s): PROBNP in the last 8760 hours.   Other Studies Reviewed Today:  Echocardiogram 11/21/19:    1. Left ventricular ejection fraction, by estimation, is 55 to 60%. The  left ventricle has normal function. The left ventricle grossly normal  regional wall motion. Left ventricular diastolic parameters are consistent  with Grade I diastolic dysfunction  (impaired relaxation).   2. Right ventricular systolic function is normal. The right ventricular  size is normal. Tricuspid regurgitation signal is inadequate for assessing  PA pressure.   3. The mitral valve is normal in structure. No evidence of mitral valve  regurgitation. No evidence of mitral stenosis.   4. The aortic valve was not well visualized. Aortic valve regurgitation  is trivial. No aortic stenosis is present.   5. Aortic dilatation noted. There is mild dilatation of the aortic root  (41 mm) and of the ascending aorta measuring 43 mm.   6. The inferior vena cava is normal in size with greater than 50%  respiratory variability, suggesting right atrial pressure of 3 mmHg.  _____________   LHC 11/24/19:   1. Diffuse nonobstructive CAD in the left circumflex and RCA with associated ectasia in the RCA 2. Severe proximal LAD stenosis correlating with CT-FFR positive lesion, treated with a 4.0x15 mm Resolute Onyx DES using IVUS guidance 3. Known normal LV systolic function by noninvasive assessment   Small diagonal side-branch occlusion post-PCI with significant associated chest pain and ST change. Vessel < 1.5 mm in diameter not suitable for PCI.   Recommend aggressive medical therapy for secondary risk reduction.   EKG Interpretation Date/Time:  Monday March 10 2024 15:04:23 EDT Ventricular Rate:  72 PR Interval:  174 QRS  Duration:  92 QT Interval:  378 QTC Calculation: 413 R Axis:   51  Text Interpretation: Normal sinus rhythm Incomplete right bundle branch block When compared with ECG of 13-Sep-2020 16:29, Abberant conduction is no longer Present Confirmed by Shlomo Corning ([id number]) on 03/10/2024 3:10:28 PM    Assessment/Plan:  1. CAD  -cardiac CT showing coronary calcium  score 442 with CAD and abnormal FFR in the pLAD  -s/p cath showing diffuse non obstructive CAD in the LCX and RCA and severe LAD stenosis treated with DES with IVUS guidance. There was a small diagonal side-branch occlusion post-PCI with significant associated chest pain and ST change. Vessel < 1.5 mm in diameter not suitable for PCI.   -He has not had any chest pain since I saw him last -Continue aspirin  81 mg daily, atorvastatin  80 mg daily, Plavix  75 mg daily, Toprol -XL 50 mg daily with as needed refills  2. SVT  - He is maintaining normal sinus rhythm on exam and he has not had any palpitations since I saw him last -seen by EP and recommended continuing BB unless he has further SVT and if so then ablation - Continue Toprol  XL 50 mg daily with as needed refills  3. Syncope  -felt to be due to SVT and decreased cerebral perfusion in the setting of SVT at 200 bpm.  - He has not had any further episodes of syncope or dizziness  4. HTN  -BP controlled on exam today -Continue amlodipine  5 mg daily, irbesartan  75 mg daily, Toprol -XL 50 mg daily with as needed refill -I have personally reviewed and interpreted outside labs performed by patient's PCP which showed serum creatinine 0.88 and potassium 4.1 on 11/05/2023  5. HLD  -LDL goal < 70 -I have personally reviewed and interpreted outside labs performed by patient's PCP which showed LDL 28, HDL 33 and ALT 18 on 11/05/2023 - Continue Zetia  10 mg daily and atorvastatin  80 mg daily with as needed refills  6.  Leg heaviness -this occurs when he stands too long but does not have calf pain with  ambulation - He was post get lower extremity ABIs but I do not see that this was ever done -the leg heaviness resolved  Current medicines  are reviewed with the patient today.  The patient does not have concerns regarding medicines other than what has been noted above.  The following changes have been made:  See above.  Labs/ tests ordered today include:    Orders Placed This Encounter  Procedures   EKG 12-Lead     Signed: Wilbert Bihari, MD  03/10/2024 3:09 PM  [facility] [address] [place], [place]  [address] Phone: [phone] Fax: [phone]

## 2024-03-10 NOTE — Patient Instructions (Signed)

## 2024-04-09 ENCOUNTER — Ambulatory Visit: Admitting: Physician Assistant

## 2024-04-09 ENCOUNTER — Encounter: Payer: Self-pay | Admitting: Physician Assistant

## 2024-04-09 ENCOUNTER — Ambulatory Visit: Payer: Self-pay

## 2024-04-09 VITALS — BP 110/74 | HR 83 | Temp 98.8°F | Ht 65.0 in | Wt 188.0 lb

## 2024-04-09 DIAGNOSIS — H1033 Unspecified acute conjunctivitis, bilateral: Secondary | ICD-10-CM

## 2024-04-09 DIAGNOSIS — H60392 Other infective otitis externa, left ear: Secondary | ICD-10-CM

## 2024-04-09 MED ORDER — OFLOXACIN 0.3 % OP SOLN: 1.0000 [drp] | Freq: Four times a day (QID) | OPHTHALMIC | 0 refills | Status: AC

## 2024-04-09 MED ORDER — CETIRIZINE HCL 10 MG PO TABS
10.0000 mg | ORAL_TABLET | Freq: Every day | ORAL | 11 refills | Status: AC
Start: 2024-04-09 — End: ?

## 2024-04-09 MED ORDER — OFLOXACIN 0.3 % OT SOLN: 5.0000 [drp] | Freq: Every day | OTIC | 0 refills | Status: AC

## 2024-04-09 NOTE — Progress Notes (Signed)
 New Patient Office Visit  Subjective    Patient ID: Carlos Valentine, male    DOB: 1956/07/24  Age: 68 y.o. MRN: [id number]  CC:  Chief Complaint  Patient presents with   Otitis Media    Patient noticed yesterday that left ear is swollen, and drainage he denies any pain   Eye Problem    Today patient has eye irritation and consistent watery eyes, he denies any pain    Discussed the use of AI scribe software for clinical note transcription with the patient, who gave verbal consent to proceed.  History of Present Illness   Carlos Valentine is a 68 year old male who presents with swollen eyes and a clogged left ear.  He experiences a sensation of ear clogging in the left ear since yesterday, similar to that during air travel, without any pain. He uses Q-tips and suspects he might have pushed wax back into the ear. He also suspects water entry during a shower but has not attempted any treatments.  His eyes became swollen this morning with clear, tear-like discharge, without itching or pain. He denies allergies and recent exposure to illness. There is no fever or chills.      Outpatient Encounter Medications as of 04/09/2024  Medication Sig   acetaminophen  (TYLENOL ) 500 MG tablet Take 500 mg by mouth every 6 (six) hours as needed for mild pain or headache.   amLODipine  (NORVASC ) 5 MG tablet Take 1 tablet (5 mg total) by mouth at bedtime.   aspirin  81 MG tablet Take 81 mg by mouth in the morning.    atorvastatin  (LIPITOR ) 80 MG tablet Take 1 tablet (80 mg total) by mouth daily.   Blood Glucose Monitoring Suppl DEVI 1 each by Does not apply route 2 (two) times daily as needed. May substitute to any manufacturer covered by patient's insurance.   cetirizine  (ZYRTEC  ALLERGY) 10 MG tablet Take 1 tablet (10 mg total) by mouth daily.   clopidogrel  (PLAVIX ) 75 MG tablet Take 1 tablet (75 mg total) by mouth daily.   ezetimibe  (ZETIA ) 10 MG tablet Take 1 tablet (10 mg total) by mouth daily.    Glucose Blood (BLOOD GLUCOSE TEST STRIPS) STRP 1 each by In Vitro route 2 (two) times daily as needed. May substitute to any manufacturer covered by patient's insurance.   irbesartan  (AVAPRO ) 75 MG tablet Take 1 tablet (75 mg total) by mouth every morning.   ketoconazole  (NIZORAL ) 2 % shampoo APPLY TO THE DAMP SKIN OF THE AFFECTED AREA. LEAVE ON 5 MINUTES THEN RINSE   Lancet Device MISC 1 each by Does not apply route 2 (two) times daily as needed. May substitute to any manufacturer covered by patient's insurance.   Lancets Misc. MISC 1 each by Does not apply route 2 (two) times daily as needed. May substitute to any manufacturer covered by patient's insurance.   metoprolol  succinate (TOPROL -XL) 50 MG 24 hr tablet TAKE 1 TABLET BY MOUTH ONCE DAILY WITH MEALS   nitroGLYCERIN  (NITROSTAT ) 0.4 MG SL tablet Place 1 tablet (0.4 mg total) under the tongue every 5 (five) minutes x 3 doses as needed for chest pain.   ofloxacin  (FLOXIN ) 0.3 % OTIC solution Place 5 drops into the left ear daily.   ofloxacin  (OCUFLOX ) 0.3 % ophthalmic solution Place 1 drop into both eyes 4 (four) times daily.   pantoprazole  (PROTONIX ) 40 MG tablet Take 1 tablet (40 mg total) by mouth daily.   Semaglutide ,0.25 or 0.5MG /DOS, (OZEMPIC , 0.25 OR 0.5 MG/DOSE,) 2  MG/3ML SOPN Inject 0.5 mg into the skin once a week.   traZODone  (DESYREL ) 50 MG tablet TAKE 1/2 TO 1 (ONE-HALF TO ONE) TABLET BY MOUTH AT BEDTIME AS NEEDED FOR SLEEP   No facility-administered encounter medications on file as of 04/09/2024.    Past Medical History:  Diagnosis Date   CAD (coronary artery disease), native coronary artery    cardiac CT showing coronary calcium  score 442 with CAD and abnormal FFR in the pLAD - s/p cath showing diffuse non obstructive CAD in the LCX and RCA and severe LAD stenosis treated with DES with IVUS guidance. There was a small diagonal side-branch occlusion post-PCI   Hyperlipidemia    Hypertension    Obesity    Tobacco use      Past Surgical History:  Procedure Laterality Date   CORONARY STENT INTERVENTION N/A 11/24/2019   Procedure: CORONARY STENT INTERVENTION;  Surgeon: Wonda Sharper, MD;  Location: [facility] INVASIVE CV LAB;  Service: Cardiovascular;  Laterality: N/A;   CORONARY ULTRASOUND/IVUS N/A 11/24/2019   Procedure: Intravascular Ultrasound/IVUS;  Surgeon: Wonda Sharper, MD;  Location: [facility] INVASIVE CV LAB;  Service: Cardiovascular;  Laterality: N/A;   LEFT HEART CATH AND CORONARY ANGIOGRAPHY N/A 11/24/2019   Procedure: LEFT HEART CATH AND CORONARY ANGIOGRAPHY;  Surgeon: Wonda Sharper, MD;  Location: [facility] INVASIVE CV LAB;  Service: Cardiovascular;  Laterality: N/A;    Family History  Problem Relation Age of Onset   Hypertension Mother     Social History   Socioeconomic History   Marital status: Legally Separated    Spouse name: Not on file   Number of children: Not on file   Years of education: Not on file   Highest education level: Not on file  Occupational History   Not on file  Tobacco Use   Smoking status: Former    Current packs/day: 0.00    Average packs/day: 1.5 packs/day for 45.0 years (67.5 ttl pk-yrs)    Types: Cigarettes    Start date: 11/25/1974    Quit date: 11/25/2019    Years since quitting: 4.3   Smokeless tobacco: Never   Tobacco comments:    as of 07/15/18 0.5 ppd  Vaping Use   Vaping status: Never Used  Substance and Sexual Activity   Alcohol use: Never    Alcohol/week: 0.0 standard drinks of alcohol   Drug use: Not Currently   Sexual activity: Yes  Other Topics Concern   Not on file  Social History Narrative   Not on file   Social Drivers of Health   Financial Resource Strain: Not on file  Food Insecurity: Not on file  Transportation Needs: Not on file  Physical Activity: Not on file  Stress: Not on file  Social Connections: Unknown (01/16/2022)   Received from [facility]   Social Network    Social Network: Not on file  Intimate Partner Violence: Unknown  (12/08/2021)   Received from [facility]   HITS    Physically Hurt: Not on file    Insult or Talk Down To: Not on file    Threaten Physical Harm: Not on file    Scream or Curse: Not on file    Review of Systems  Constitutional:  Negative for chills and fever.  HENT:  Positive for ear discharge and hearing loss. Negative for congestion, ear pain, sinus pain and sore throat.   Eyes:  Positive for discharge and redness. Negative for blurred vision.  Respiratory:  Negative for shortness of breath.  Cardiovascular:  Negative for chest pain.  Gastrointestinal:  Negative for nausea and vomiting.  Genitourinary: Negative.   Musculoskeletal:  Negative for myalgias.  Skin: Negative.   Neurological: Negative.   Endo/Heme/Allergies: Negative.   Psychiatric/Behavioral: Negative.          Objective    BP 110/74 (BP Location: Left Arm, Patient Position: Sitting, Cuff Size: Large)   Pulse 83   Temp 98.8 F (37.1 C) (Temporal)   Ht 5' 5 (1.651 m)   Wt 188 lb (85.3 kg)   SpO2 94%   BMI 31.28 kg/m   Physical Exam Vitals and nursing note reviewed.  Constitutional:      Appearance: Normal appearance.  HENT:     Right Ear: Tympanic membrane, ear canal and external ear normal.     Left Ear: Drainage and tenderness present. A middle ear effusion is present.     Ears:     Comments: Unable to visualize left tympanic membrane    Nose: Nose normal.     Mouth/Throat:     Mouth: Mucous membranes are moist.     Pharynx: Oropharynx is clear.  Eyes:     General:        Right eye: Discharge present. No foreign body or hordeolum.        Left eye: Discharge present.No foreign body or hordeolum.     Extraocular Movements:     Right eye: Normal extraocular motion.     Left eye: Normal extraocular motion.     Conjunctiva/sclera:     Right eye: Right conjunctiva is injected.     Left eye: Left conjunctiva is injected.  Cardiovascular:     Rate and Rhythm: Normal rate and regular rhythm.      Pulses: Normal pulses.     Heart sounds: Normal heart sounds.  Pulmonary:     Effort: Pulmonary effort is normal.     Breath sounds: Normal breath sounds.  Musculoskeletal:        General: Normal range of motion.     Cervical back: Normal range of motion and neck supple.  Skin:    General: Skin is warm and dry.  Neurological:     General: No focal deficit present.     Mental Status: He is alert and oriented to person, place, and time.  Psychiatric:        Mood and Affect: Mood normal.        Behavior: Behavior normal.        Thought Content: Thought content normal.        Judgment: Judgment normal.         Assessment & Plan:   Problem List Items Addressed This Visit   None Visit Diagnoses       Infective otitis externa of left ear    -  Primary   Relevant Medications   ofloxacin  (FLOXIN ) 0.3 % OTIC solution   cetirizine  (ZYRTEC  ALLERGY) 10 MG tablet     Acute bacterial conjunctivitis of both eyes       Relevant Medications   ofloxacin  (OCUFLOX ) 0.3 % ophthalmic solution   cetirizine  (ZYRTEC  ALLERGY) 10 MG tablet      Assessment and Plan Left otitis externa Acute otitis externa in left ear with fullness and wetness. Tympanic membrane obscured by fluid. Likely due to water exposure or Q-tip trauma. - Prescribe antibiotic eardrops for left ear. Patient education given for supportive care, red flags given for prompt reevaluation.  Bilateral watery eye discharge Acute bilateral watery  eye discharge without itching or pain. Clear discharge. - Prescribe antibiotic eye drops. - Recommend allergy medication (Zyrtec ). - Advise use of cold compresses for symptomatic relief.   I have reviewed the patient's medical history (PMH, PSH, Social History, Family History, Medications, and allergies) , and have been updated if relevant. I spent 30 minutes reviewing chart and  face to face time with patient.   Return if symptoms worsen or fail to improve.   Kirk RAMAN Mayers,  PA-C

## 2024-04-09 NOTE — Patient Instructions (Signed)
 VISIT SUMMARY:  Today, you came in because of swollen eyes and a clogged left ear. You mentioned that your left ear has felt clogged since yesterday, possibly due to using Q-tips or water getting in during a shower. This morning, your eyes became swollen with a clear discharge, but without any itching or pain.  YOUR PLAN:  -LEFT OTITIS EXTERNA: You have an infection in the outer part of your left ear, likely caused by water exposure or using Q-tips. We have prescribed antibiotic eardrops to treat the infection. Please use the drops as directed. We will see you again on April 17, 2024, to check on your ear.  -BILATERAL WATERY EYE DISCHARGE: You have a clear discharge from both eyes, which is not accompanied by itching or pain. This could be due to an infection or an allergic reaction. We have prescribed antibiotic eye drops and recommended taking allergy medication (Zyrtec ). Using cold compresses can also help relieve the symptoms.  Otitis externa Otitis Externa  La otitis externa es una infeccin del canal auditivo externo. El canal auditivo externo es la zona que est entre el exterior de la oreja y el tmpano. A la otitis externa tambin se la llama odo de nadador. Cules son las causas? Las causas ms frecuentes de esta afeccin incluyen las siguientes: Nadar en agua sucia. [place] en el odo. Una lesin en el interior del odo. Un objeto atascado en el odo. Un corte o rasguo en la parte exterior del odo o en el canal auditivo. Qu incrementa el riesgo? Es ms probable que presente esta afeccin si nada con frecuencia. Cules son los signos o sntomas? En general, el primer sntoma de esta afeccin es la picazn en el canal auditivo. Los sntomas posteriores de la afeccin incluyen los siguientes: Hinchazn del odo. Enrojecimiento del odo. Dolor de odo. El dolor puede empeorar cuando se tira de la [place]. Secrecin de pus del odo. Cmo se diagnostica? Esta afeccin puede  diagnosticarse con un examen del odo y un anlisis del lquido que sale del odo para Engineer, manufacturing si tiene bacterias y hongos. Cmo se trata? El tratamiento de esta afeccin puede incluir: Gotas ticas con antibitico. Generalmente se aplican durante 10 a 14 das. Medicamentos para reducir la picazn y la hinchazn. Siga estas instrucciones en su casa: Si le recetaron gotas ticas con antibitico, selas como se lo haya indicado el mdico. No deje de usar el antibitico aunque comience a sentirse mejor. Use los medicamentos de venta libre y los recetados solamente como se lo haya indicado el mdico. Evite que le entre agua en los odos, como se lo haya indicado el mdico. Esto puede incluir no nadar ni Microbiologist de agua durante [employer]. Concurra a todas las visitas de seguimiento. Esto es importante. Cmo se evita? Mantenga secos los odos. Use la punta de una toalla para secarse los odos despus de nadar o de darse un bao. Evite rascarse o poner objetos en el odo. Estas acciones pueden daar el canal auditivo o remover el recubrimiento de cera que lo protege y as Child psychotherapist de bacterias y hongos. Evite nadar en lagos, en agua contaminada o en piscinas que pueden no tener el cloro suficiente. Comunquese con un mdico si: Tiene fiebre. Su odo contina rojo, hinchado, le duele o supura pus despus de 3 das. El dolor, la hinchazn o el enrojecimiento empeoran. Siente un dolor de cabeza intenso. Solicite ayuda de inmediato si: Tiene en la zona detrs de la oreja roja, hinchada, le  duele o est sensible. Resumen La otitis externa es una infeccin del canal auditivo externo. Las causas frecuentes son nadar en agua sucia, que quede humedad en el odo o tener un corte o un rasguo en el odo. Los sntomas son dolor, enrojecimiento e hinchazn del canal auditivo. Si le recetaron gotas ticas con antibitico, selas como se lo haya indicado el mdico. No deje de usar  el antibitico aunque comience a sentirse mejor. Esta informacin no tiene Theme park manager el consejo del mdico. Asegrese de hacerle al mdico cualquier pregunta que tenga. Document Revised: 11/23/2020 Document Reviewed: 11/23/2020 Elsevier Patient Education  2024 [employer].

## 2024-04-09 NOTE — Telephone Encounter (Signed)
 FYI Only or Action Required?: FYI only for provider.  Patient was last seen in primary care on 11/05/2023 by Willo Mini, NP.  Called Nurse Triage reporting Allergies.  Symptoms began yesterday.  Interventions attempted: Nothing.  Symptoms are: unchanged.  Triage Disposition: Home Care  Patient/caregiver understands and will follow disposition?: Yes  Pt given information for the mobile bus for his ears   Copied from CRM #[id number]. Topic: Clinical - Red Word Triage >> Apr 09, 2024 12:24 PM Susanna ORN wrote: Red Word that prompted transfer to Nurse Triage: Patient states he would like to see his provider or someone in the clinic today, if possible. States his left ear feels like he's on an airplane & feels like water is in it & he can't hardly hear. Also states his eyes are swollen. Reason for Disposition  MILD face swelling of unknown cause  Answer Assessment - Initial Assessment Questions 1. ONSET: When did the swelling start? (e.g., minutes, hours, days)     Yesterday ears started 2. LOCATION: What part of the face is swollen? (e.g., cheek, entire face, jaw joint area, under jaw)     Eyes are puffy 3. SEVERITY: How swollen is it?     Slightly puffy 4. ITCHING: Is there any itching? If Yes, ask: How much?   (Scale 1-10; mild, moderate or severe)     no 5. PAIN: Is the swelling painful to touch? If Yes, ask: How painful is it?   (Scale 0-10; mild, moderate or severe)     no 6. FEVER: Do you have a fever? If Yes, ask: What is it, how was it measured, and when did it start?      no 7. CAUSE: What do you think is causing the face swelling?     unknown 8. NEW MEDICINES: Have there been any new medicines started recently?     no 9. RECURRENT SYMPTOM: Have you had face swelling before? If Yes, ask: When was the last time? What happened that time?     no 10. OTHER SYMPTOMS: Do you have any other symptoms? (e.g., leg swelling, toothache)       Ears feel  full and muffled denies any other symptoms, states yes have slight clear drainage and that he does use q-tips to clean his ears.  Protocols used: Face Swelling-A-AH

## 2024-04-17 ENCOUNTER — Encounter: Payer: Self-pay | Admitting: Medical-Surgical

## 2024-04-17 ENCOUNTER — Ambulatory Visit (INDEPENDENT_AMBULATORY_CARE_PROVIDER_SITE_OTHER): Admitting: Medical-Surgical

## 2024-04-17 VITALS — BP 116/72 | HR 71 | Ht 65.0 in | Wt 190.0 lb

## 2024-04-17 DIAGNOSIS — Z7985 Long-term (current) use of injectable non-insulin antidiabetic drugs: Secondary | ICD-10-CM

## 2024-04-17 DIAGNOSIS — R252 Cramp and spasm: Secondary | ICD-10-CM | POA: Diagnosis not present

## 2024-04-17 DIAGNOSIS — H6122 Impacted cerumen, left ear: Secondary | ICD-10-CM

## 2024-04-17 DIAGNOSIS — E119 Type 2 diabetes mellitus without complications: Secondary | ICD-10-CM | POA: Diagnosis not present

## 2024-04-17 DIAGNOSIS — M7552 Bursitis of left shoulder: Secondary | ICD-10-CM

## 2024-04-17 LAB — POCT GLYCOSYLATED HEMOGLOBIN (HGB A1C): HbA1c, POC (controlled diabetic range): 5.7 % (ref 0.0–7.0)

## 2024-04-17 LAB — POCT UA - MICROALBUMIN
Albumin/Creatinine Ratio, Urine, POC: <30
Creatinine, POC: 100 mg/dL
Microalbumin Ur, POC: 30 mg/L

## 2024-04-17 MED ORDER — CYCLOBENZAPRINE HCL 10 MG PO TABS: 5.0000 mg | ORAL_TABLET | Freq: Three times a day (TID) | ORAL | 0 refills | Status: AC | PRN

## 2024-04-17 MED ORDER — NAPROXEN 500 MG PO TABS: 500.0000 mg | ORAL_TABLET | Freq: Two times a day (BID) | ORAL | 3 refills | Status: AC

## 2024-04-17 MED ORDER — SEMAGLUTIDE (1 MG/DOSE) 4 MG/3ML ~~LOC~~ SOPN: 1.0000 mg | PEN_INJECTOR | SUBCUTANEOUS | 1 refills | Status: DC

## 2024-04-17 NOTE — Progress Notes (Signed)
 Established patient visit   History of Present Illness   Discussed the use of AI scribe software for clinical note transcription with the patient, who gave verbal consent to proceed.  History of Present Illness   Carlos Valentine is a 68 year old male who presents for diabetes follow up with additional complaints of ear blockage and shoulder pain.  He experiences a blocked sensation in his ear, feeling 'clogged up', which persists despite ear drops. He regularly uses Q-tips, potentially contributing to the blockage. His eyes were previously watery, but this has resolved. He has seasonal sinus issues, typically during the transition from summer to winter.  He has left shoulder pain for about a week, described as a stinging sensation over the upper deltoid that worsens with movement. There is no specific injury or trauma, but he engages in regular lifting activities. A muscle rub from [place] provides temporary relief. No recent falls or specific injuries to the shoulder.  He manages diabetes with Ozempic 0.5mg  weekly and with A1c of 5.7% on last check. He experiences nocturia, waking two to three times a night to urinate, despite not drinking fluids after 6 PM. There is no difficulty initiating urination, dribbling, or pain with urination.        Physical Exam   Physical Exam Vitals and nursing note reviewed.  Constitutional:      General: He is not in acute distress.    Appearance: Normal appearance.  HENT:     Head: Normocephalic and atraumatic.     Right Ear: Tympanic membrane, ear canal and external ear normal. There is no impacted cerumen.     Left Ear: There is impacted cerumen.  Cardiovascular:     Rate and Rhythm: Normal rate and regular rhythm.     Pulses: Normal pulses.     Heart sounds: Normal heart sounds. No murmur heard.    No friction rub. No gallop.  Pulmonary:     Effort: Pulmonary effort is normal. No respiratory distress.     Breath sounds: Normal  breath sounds.  Musculoskeletal:     Left shoulder: Tenderness present.       Arms:  Skin:    General: Skin is warm and dry.  Neurological:     Mental Status: He is alert and oriented to person, place, and time.  Psychiatric:        Mood and Affect: Mood normal.        Behavior: Behavior normal.        Thought Content: Thought content normal.        Judgment: Judgment normal.     Assessment & Plan   Assessment and Plan    Impacted cerumen, left ears Left ear blockage with impacted cerumen causing hearing difficulties.  - Perform ear irrigation to remove impacted cerumen. - Advise against using Q-tips in the ears. - Recommend purchasing an ear irrigation kit for home use.  Left shoulder pain, likely bursitis Left shoulder pain with stinging, burning sensation, suggestive of bursitis. Exacerbated by movement, no trauma history. - Consider x-rays to evaluate the shoulder. Declining today but may be reconsidered if s/s do not improve. - Avoiding steroids to prevent hyperglycemia. Start Naprosyn 500mg  BID prn. - Advise on home exercises and stretches. - Suggest using ice and heat therapy. - Consider lidocaine patches for pain relief.  Type 2 diabetes mellitus, well controlled Type 2 diabetes well controlled with A1c of 5.7%. On Ozempic with no side effects. Desires dose  increase for weight loss. - Increase Ozempic dose to 1 mg to assist with weight loss.  Muscle cramps Experiencing muscle cramps in legs and arms. - Prescribe magnesium 500 mg every 12 hours as needed for cramps.     Follow up   Return in about 6 months (around 10/18/2024) for DM/HTN/HLD follow up.  __________________________________ Zada FREDRIK Palin, DNP, APRN, FNP-BC Primary Care and Sports Medicine [facility] [place]

## 2024-05-06 ENCOUNTER — Ambulatory Visit: Admitting: Medical-Surgical

## 2024-05-08 ENCOUNTER — Ambulatory Visit: Admitting: Medical-Surgical

## 2024-07-13 ENCOUNTER — Other Ambulatory Visit: Payer: Self-pay | Admitting: Cardiology

## 2024-09-29 ENCOUNTER — Other Ambulatory Visit: Payer: Self-pay | Admitting: Medical-Surgical

## 2024-10-07 ENCOUNTER — Other Ambulatory Visit (HOSPITAL_COMMUNITY): Payer: Self-pay

## 2024-10-09 ENCOUNTER — Other Ambulatory Visit (HOSPITAL_COMMUNITY): Payer: Self-pay

## 2024-10-10 ENCOUNTER — Other Ambulatory Visit (HOSPITAL_COMMUNITY): Payer: Self-pay

## 2024-10-10 ENCOUNTER — Telehealth: Payer: Self-pay

## 2024-10-10 NOTE — Telephone Encounter (Signed)
 Pharmacy Patient Advocate Encounter   Received notification from CoverMyMeds that prior authorization for OZEMPIC  is required/requested.   Insurance verification completed.   The patient is insured through [facility].   Per test claim: PA required; PA submitted to above mentioned insurance via Fax Key/confirmation #/[id number] Status is pending

## 2024-10-17 ENCOUNTER — Ambulatory Visit: Admitting: Medical-Surgical
